# Patient Record
Sex: Male | Born: 1965 | Race: White | Hispanic: No | Marital: Single | Smoking: Former smoker
Health system: Southern US, Community
[De-identification: ages and names within clinical notes are randomized; demographics above are authoritative.]

## PROBLEM LIST (undated history)

## (undated) DIAGNOSIS — I10 Essential (primary) hypertension: Secondary | ICD-10-CM

## (undated) DIAGNOSIS — F251 Schizoaffective disorder, depressive type: Secondary | ICD-10-CM

## (undated) DIAGNOSIS — G4711 Idiopathic hypersomnia with long sleep time: Secondary | ICD-10-CM

## (undated) DIAGNOSIS — F259 Schizoaffective disorder, unspecified: Secondary | ICD-10-CM

## (undated) DIAGNOSIS — E785 Hyperlipidemia, unspecified: Secondary | ICD-10-CM

## (undated) DIAGNOSIS — G4733 Obstructive sleep apnea (adult) (pediatric): Secondary | ICD-10-CM

## (undated) DIAGNOSIS — M545 Low back pain, unspecified: Secondary | ICD-10-CM

## (undated) DIAGNOSIS — F4310 Post-traumatic stress disorder, unspecified: Secondary | ICD-10-CM

## (undated) HISTORY — PX: ABDOMINAL HERNIA REPAIR: SHX539

## (undated) HISTORY — DX: Low back pain, unspecified: M54.50

## (undated) HISTORY — DX: Hyperlipidemia, unspecified: E78.5

## (undated) HISTORY — PX: KNEE SURGERY: SHX244

## (undated) HISTORY — DX: Essential (primary) hypertension: I10

## (undated) HISTORY — DX: Schizoaffective disorder, depressive type: F25.1

## (undated) HISTORY — DX: Post-traumatic stress disorder, unspecified: F43.10

## (undated) HISTORY — DX: Idiopathic hypersomnia with long sleep time: G47.11

## (undated) HISTORY — DX: Obstructive sleep apnea (adult) (pediatric): G47.33

## (undated) HISTORY — PX: TONSILLECTOMY: SUR1361

## (undated) HISTORY — DX: Schizoaffective disorder, unspecified: F25.9

---

## 2011-06-04 DIAGNOSIS — J00 Acute nasopharyngitis [common cold]: Secondary | ICD-10-CM | POA: Diagnosis not present

## 2011-07-22 DIAGNOSIS — F848 Other pervasive developmental disorders: Secondary | ICD-10-CM | POA: Diagnosis not present

## 2011-09-08 DIAGNOSIS — B351 Tinea unguium: Secondary | ICD-10-CM | POA: Diagnosis not present

## 2011-09-08 DIAGNOSIS — D1739 Benign lipomatous neoplasm of skin and subcutaneous tissue of other sites: Secondary | ICD-10-CM | POA: Diagnosis not present

## 2011-09-08 DIAGNOSIS — B079 Viral wart, unspecified: Secondary | ICD-10-CM | POA: Diagnosis not present

## 2011-09-08 DIAGNOSIS — L219 Seborrheic dermatitis, unspecified: Secondary | ICD-10-CM | POA: Diagnosis not present

## 2011-09-08 DIAGNOSIS — L719 Rosacea, unspecified: Secondary | ICD-10-CM | POA: Diagnosis not present

## 2011-09-24 DIAGNOSIS — F848 Other pervasive developmental disorders: Secondary | ICD-10-CM | POA: Diagnosis not present

## 2011-09-30 DIAGNOSIS — R5383 Other fatigue: Secondary | ICD-10-CM | POA: Diagnosis not present

## 2011-09-30 DIAGNOSIS — R5381 Other malaise: Secondary | ICD-10-CM | POA: Diagnosis not present

## 2011-09-30 DIAGNOSIS — Z79899 Other long term (current) drug therapy: Secondary | ICD-10-CM | POA: Diagnosis not present

## 2011-09-30 DIAGNOSIS — Z0000 Encounter for general adult medical examination without abnormal findings: Secondary | ICD-10-CM | POA: Diagnosis not present

## 2011-10-01 DIAGNOSIS — F329 Major depressive disorder, single episode, unspecified: Secondary | ICD-10-CM | POA: Diagnosis not present

## 2011-10-01 DIAGNOSIS — E785 Hyperlipidemia, unspecified: Secondary | ICD-10-CM | POA: Diagnosis not present

## 2011-10-14 DIAGNOSIS — B079 Viral wart, unspecified: Secondary | ICD-10-CM | POA: Diagnosis not present

## 2011-10-14 DIAGNOSIS — L821 Other seborrheic keratosis: Secondary | ICD-10-CM | POA: Diagnosis not present

## 2011-10-14 DIAGNOSIS — B351 Tinea unguium: Secondary | ICD-10-CM | POA: Diagnosis not present

## 2011-10-14 DIAGNOSIS — D1739 Benign lipomatous neoplasm of skin and subcutaneous tissue of other sites: Secondary | ICD-10-CM | POA: Diagnosis not present

## 2011-10-14 DIAGNOSIS — L719 Rosacea, unspecified: Secondary | ICD-10-CM | POA: Diagnosis not present

## 2011-11-24 DIAGNOSIS — F848 Other pervasive developmental disorders: Secondary | ICD-10-CM | POA: Diagnosis not present

## 2011-12-06 DIAGNOSIS — H538 Other visual disturbances: Secondary | ICD-10-CM | POA: Diagnosis not present

## 2011-12-06 DIAGNOSIS — H5210 Myopia, unspecified eye: Secondary | ICD-10-CM | POA: Diagnosis not present

## 2011-12-07 DIAGNOSIS — E785 Hyperlipidemia, unspecified: Secondary | ICD-10-CM | POA: Diagnosis not present

## 2011-12-07 DIAGNOSIS — R5382 Chronic fatigue, unspecified: Secondary | ICD-10-CM | POA: Diagnosis not present

## 2011-12-10 DIAGNOSIS — E785 Hyperlipidemia, unspecified: Secondary | ICD-10-CM | POA: Diagnosis not present

## 2011-12-10 DIAGNOSIS — K219 Gastro-esophageal reflux disease without esophagitis: Secondary | ICD-10-CM | POA: Diagnosis not present

## 2011-12-10 DIAGNOSIS — F309 Manic episode, unspecified: Secondary | ICD-10-CM | POA: Diagnosis not present

## 2012-01-19 DIAGNOSIS — F848 Other pervasive developmental disorders: Secondary | ICD-10-CM | POA: Diagnosis not present

## 2012-01-25 DIAGNOSIS — L719 Rosacea, unspecified: Secondary | ICD-10-CM | POA: Diagnosis not present

## 2012-01-25 DIAGNOSIS — B079 Viral wart, unspecified: Secondary | ICD-10-CM | POA: Diagnosis not present

## 2012-01-25 DIAGNOSIS — B351 Tinea unguium: Secondary | ICD-10-CM | POA: Diagnosis not present

## 2012-02-04 DIAGNOSIS — J00 Acute nasopharyngitis [common cold]: Secondary | ICD-10-CM | POA: Diagnosis not present

## 2012-03-02 DIAGNOSIS — B079 Viral wart, unspecified: Secondary | ICD-10-CM | POA: Diagnosis not present

## 2012-03-16 DIAGNOSIS — B079 Viral wart, unspecified: Secondary | ICD-10-CM | POA: Diagnosis not present

## 2012-03-20 DIAGNOSIS — F848 Other pervasive developmental disorders: Secondary | ICD-10-CM | POA: Diagnosis not present

## 2012-03-30 DIAGNOSIS — B079 Viral wart, unspecified: Secondary | ICD-10-CM | POA: Diagnosis not present

## 2012-04-11 DIAGNOSIS — B079 Viral wart, unspecified: Secondary | ICD-10-CM | POA: Diagnosis not present

## 2012-04-12 DIAGNOSIS — Z6826 Body mass index (BMI) 26.0-26.9, adult: Secondary | ICD-10-CM | POA: Diagnosis not present

## 2012-04-12 DIAGNOSIS — Z23 Encounter for immunization: Secondary | ICD-10-CM | POA: Diagnosis not present

## 2012-04-12 DIAGNOSIS — E782 Mixed hyperlipidemia: Secondary | ICD-10-CM | POA: Diagnosis not present

## 2012-04-12 DIAGNOSIS — K219 Gastro-esophageal reflux disease without esophagitis: Secondary | ICD-10-CM | POA: Diagnosis not present

## 2012-04-12 DIAGNOSIS — F329 Major depressive disorder, single episode, unspecified: Secondary | ICD-10-CM | POA: Diagnosis not present

## 2012-04-25 DIAGNOSIS — B079 Viral wart, unspecified: Secondary | ICD-10-CM | POA: Diagnosis not present

## 2012-05-09 DIAGNOSIS — B079 Viral wart, unspecified: Secondary | ICD-10-CM | POA: Diagnosis not present

## 2012-05-17 DIAGNOSIS — E785 Hyperlipidemia, unspecified: Secondary | ICD-10-CM | POA: Diagnosis not present

## 2012-05-18 DIAGNOSIS — F848 Other pervasive developmental disorders: Secondary | ICD-10-CM | POA: Diagnosis not present

## 2012-07-27 DIAGNOSIS — R51 Headache: Secondary | ICD-10-CM | POA: Diagnosis not present

## 2012-08-07 DIAGNOSIS — H60399 Other infective otitis externa, unspecified ear: Secondary | ICD-10-CM | POA: Diagnosis not present

## 2012-08-07 DIAGNOSIS — Z6828 Body mass index (BMI) 28.0-28.9, adult: Secondary | ICD-10-CM | POA: Diagnosis not present

## 2012-09-14 DIAGNOSIS — F848 Other pervasive developmental disorders: Secondary | ICD-10-CM | POA: Diagnosis not present

## 2012-09-18 DIAGNOSIS — I10 Essential (primary) hypertension: Secondary | ICD-10-CM | POA: Diagnosis not present

## 2012-10-26 DIAGNOSIS — I10 Essential (primary) hypertension: Secondary | ICD-10-CM | POA: Diagnosis not present

## 2012-10-26 DIAGNOSIS — L259 Unspecified contact dermatitis, unspecified cause: Secondary | ICD-10-CM | POA: Diagnosis not present

## 2012-10-30 DIAGNOSIS — E785 Hyperlipidemia, unspecified: Secondary | ICD-10-CM | POA: Diagnosis not present

## 2012-10-30 DIAGNOSIS — J209 Acute bronchitis, unspecified: Secondary | ICD-10-CM | POA: Diagnosis not present

## 2012-10-30 DIAGNOSIS — F329 Major depressive disorder, single episode, unspecified: Secondary | ICD-10-CM | POA: Diagnosis not present

## 2012-10-30 DIAGNOSIS — Z6828 Body mass index (BMI) 28.0-28.9, adult: Secondary | ICD-10-CM | POA: Diagnosis not present

## 2012-10-31 DIAGNOSIS — K219 Gastro-esophageal reflux disease without esophagitis: Secondary | ICD-10-CM | POA: Diagnosis not present

## 2012-10-31 DIAGNOSIS — E785 Hyperlipidemia, unspecified: Secondary | ICD-10-CM | POA: Diagnosis not present

## 2012-10-31 DIAGNOSIS — Z6828 Body mass index (BMI) 28.0-28.9, adult: Secondary | ICD-10-CM | POA: Diagnosis not present

## 2012-10-31 DIAGNOSIS — L259 Unspecified contact dermatitis, unspecified cause: Secondary | ICD-10-CM | POA: Diagnosis not present

## 2012-11-20 DIAGNOSIS — F848 Other pervasive developmental disorders: Secondary | ICD-10-CM | POA: Diagnosis not present

## 2013-01-22 DIAGNOSIS — F848 Other pervasive developmental disorders: Secondary | ICD-10-CM | POA: Diagnosis not present

## 2013-01-23 DIAGNOSIS — Z0100 Encounter for examination of eyes and vision without abnormal findings: Secondary | ICD-10-CM | POA: Diagnosis not present

## 2013-03-19 DIAGNOSIS — F848 Other pervasive developmental disorders: Secondary | ICD-10-CM | POA: Diagnosis not present

## 2013-04-02 DIAGNOSIS — K219 Gastro-esophageal reflux disease without esophagitis: Secondary | ICD-10-CM | POA: Diagnosis not present

## 2013-04-02 DIAGNOSIS — E785 Hyperlipidemia, unspecified: Secondary | ICD-10-CM | POA: Diagnosis not present

## 2013-04-02 DIAGNOSIS — Z6828 Body mass index (BMI) 28.0-28.9, adult: Secondary | ICD-10-CM | POA: Diagnosis not present

## 2013-04-02 DIAGNOSIS — J209 Acute bronchitis, unspecified: Secondary | ICD-10-CM | POA: Diagnosis not present

## 2013-04-19 DIAGNOSIS — I10 Essential (primary) hypertension: Secondary | ICD-10-CM | POA: Diagnosis not present

## 2013-04-19 DIAGNOSIS — Z6827 Body mass index (BMI) 27.0-27.9, adult: Secondary | ICD-10-CM | POA: Diagnosis not present

## 2013-04-19 DIAGNOSIS — K219 Gastro-esophageal reflux disease without esophagitis: Secondary | ICD-10-CM | POA: Diagnosis not present

## 2013-04-19 DIAGNOSIS — Z23 Encounter for immunization: Secondary | ICD-10-CM | POA: Diagnosis not present

## 2013-04-19 DIAGNOSIS — E785 Hyperlipidemia, unspecified: Secondary | ICD-10-CM | POA: Diagnosis not present

## 2013-05-17 DIAGNOSIS — F848 Other pervasive developmental disorders: Secondary | ICD-10-CM | POA: Diagnosis not present

## 2013-06-19 DIAGNOSIS — I10 Essential (primary) hypertension: Secondary | ICD-10-CM | POA: Diagnosis not present

## 2013-06-19 DIAGNOSIS — F329 Major depressive disorder, single episode, unspecified: Secondary | ICD-10-CM | POA: Diagnosis not present

## 2013-06-19 DIAGNOSIS — E785 Hyperlipidemia, unspecified: Secondary | ICD-10-CM | POA: Diagnosis not present

## 2013-06-19 DIAGNOSIS — K219 Gastro-esophageal reflux disease without esophagitis: Secondary | ICD-10-CM | POA: Diagnosis not present

## 2013-06-21 DIAGNOSIS — E785 Hyperlipidemia, unspecified: Secondary | ICD-10-CM | POA: Diagnosis not present

## 2013-06-21 DIAGNOSIS — K219 Gastro-esophageal reflux disease without esophagitis: Secondary | ICD-10-CM | POA: Diagnosis not present

## 2013-06-21 DIAGNOSIS — F329 Major depressive disorder, single episode, unspecified: Secondary | ICD-10-CM | POA: Diagnosis not present

## 2013-06-21 DIAGNOSIS — J029 Acute pharyngitis, unspecified: Secondary | ICD-10-CM | POA: Diagnosis not present

## 2013-06-26 DIAGNOSIS — I10 Essential (primary) hypertension: Secondary | ICD-10-CM | POA: Diagnosis not present

## 2013-06-26 DIAGNOSIS — E785 Hyperlipidemia, unspecified: Secondary | ICD-10-CM | POA: Diagnosis not present

## 2013-06-26 DIAGNOSIS — K219 Gastro-esophageal reflux disease without esophagitis: Secondary | ICD-10-CM | POA: Diagnosis not present

## 2013-06-26 DIAGNOSIS — F329 Major depressive disorder, single episode, unspecified: Secondary | ICD-10-CM | POA: Diagnosis not present

## 2013-06-29 DIAGNOSIS — E785 Hyperlipidemia, unspecified: Secondary | ICD-10-CM | POA: Diagnosis not present

## 2013-06-29 DIAGNOSIS — J209 Acute bronchitis, unspecified: Secondary | ICD-10-CM | POA: Diagnosis not present

## 2013-06-29 DIAGNOSIS — I10 Essential (primary) hypertension: Secondary | ICD-10-CM | POA: Diagnosis not present

## 2013-06-29 DIAGNOSIS — K219 Gastro-esophageal reflux disease without esophagitis: Secondary | ICD-10-CM | POA: Diagnosis not present

## 2013-07-03 DIAGNOSIS — F848 Other pervasive developmental disorders: Secondary | ICD-10-CM | POA: Diagnosis not present

## 2013-07-03 DIAGNOSIS — F259 Schizoaffective disorder, unspecified: Secondary | ICD-10-CM | POA: Diagnosis not present

## 2013-07-06 DIAGNOSIS — K219 Gastro-esophageal reflux disease without esophagitis: Secondary | ICD-10-CM | POA: Diagnosis not present

## 2013-07-06 DIAGNOSIS — I10 Essential (primary) hypertension: Secondary | ICD-10-CM | POA: Diagnosis not present

## 2013-07-06 DIAGNOSIS — F329 Major depressive disorder, single episode, unspecified: Secondary | ICD-10-CM | POA: Diagnosis not present

## 2013-07-06 DIAGNOSIS — E785 Hyperlipidemia, unspecified: Secondary | ICD-10-CM | POA: Diagnosis not present

## 2013-07-20 DIAGNOSIS — E785 Hyperlipidemia, unspecified: Secondary | ICD-10-CM | POA: Diagnosis not present

## 2013-07-20 DIAGNOSIS — I10 Essential (primary) hypertension: Secondary | ICD-10-CM | POA: Diagnosis not present

## 2013-07-20 DIAGNOSIS — Z0000 Encounter for general adult medical examination without abnormal findings: Secondary | ICD-10-CM | POA: Diagnosis not present

## 2013-07-20 DIAGNOSIS — Z125 Encounter for screening for malignant neoplasm of prostate: Secondary | ICD-10-CM | POA: Diagnosis not present

## 2013-08-14 DIAGNOSIS — K219 Gastro-esophageal reflux disease without esophagitis: Secondary | ICD-10-CM | POA: Diagnosis not present

## 2013-08-14 DIAGNOSIS — I10 Essential (primary) hypertension: Secondary | ICD-10-CM | POA: Diagnosis not present

## 2013-08-14 DIAGNOSIS — R6884 Jaw pain: Secondary | ICD-10-CM | POA: Diagnosis not present

## 2013-08-14 DIAGNOSIS — Z6829 Body mass index (BMI) 29.0-29.9, adult: Secondary | ICD-10-CM | POA: Diagnosis not present

## 2013-08-23 DIAGNOSIS — I10 Essential (primary) hypertension: Secondary | ICD-10-CM | POA: Diagnosis not present

## 2013-08-23 DIAGNOSIS — K219 Gastro-esophageal reflux disease without esophagitis: Secondary | ICD-10-CM | POA: Diagnosis not present

## 2013-08-23 DIAGNOSIS — M79609 Pain in unspecified limb: Secondary | ICD-10-CM | POA: Diagnosis not present

## 2013-08-23 DIAGNOSIS — Z6829 Body mass index (BMI) 29.0-29.9, adult: Secondary | ICD-10-CM | POA: Diagnosis not present

## 2013-08-28 DIAGNOSIS — K219 Gastro-esophageal reflux disease without esophagitis: Secondary | ICD-10-CM | POA: Diagnosis not present

## 2013-08-28 DIAGNOSIS — F329 Major depressive disorder, single episode, unspecified: Secondary | ICD-10-CM | POA: Diagnosis not present

## 2013-08-28 DIAGNOSIS — I10 Essential (primary) hypertension: Secondary | ICD-10-CM | POA: Diagnosis not present

## 2013-08-28 DIAGNOSIS — M25529 Pain in unspecified elbow: Secondary | ICD-10-CM | POA: Diagnosis not present

## 2013-09-04 DIAGNOSIS — E785 Hyperlipidemia, unspecified: Secondary | ICD-10-CM | POA: Diagnosis not present

## 2013-09-04 DIAGNOSIS — K219 Gastro-esophageal reflux disease without esophagitis: Secondary | ICD-10-CM | POA: Diagnosis not present

## 2013-09-04 DIAGNOSIS — R7989 Other specified abnormal findings of blood chemistry: Secondary | ICD-10-CM | POA: Diagnosis not present

## 2013-09-04 DIAGNOSIS — I10 Essential (primary) hypertension: Secondary | ICD-10-CM | POA: Diagnosis not present

## 2013-09-17 DIAGNOSIS — M25529 Pain in unspecified elbow: Secondary | ICD-10-CM | POA: Diagnosis not present

## 2013-09-17 DIAGNOSIS — R7989 Other specified abnormal findings of blood chemistry: Secondary | ICD-10-CM | POA: Diagnosis not present

## 2013-09-17 DIAGNOSIS — K219 Gastro-esophageal reflux disease without esophagitis: Secondary | ICD-10-CM | POA: Diagnosis not present

## 2013-09-17 DIAGNOSIS — I10 Essential (primary) hypertension: Secondary | ICD-10-CM | POA: Diagnosis not present

## 2013-09-25 DIAGNOSIS — M25529 Pain in unspecified elbow: Secondary | ICD-10-CM | POA: Diagnosis not present

## 2013-10-03 DIAGNOSIS — I10 Essential (primary) hypertension: Secondary | ICD-10-CM | POA: Diagnosis not present

## 2013-10-03 DIAGNOSIS — E785 Hyperlipidemia, unspecified: Secondary | ICD-10-CM | POA: Diagnosis not present

## 2013-10-03 DIAGNOSIS — R7989 Other specified abnormal findings of blood chemistry: Secondary | ICD-10-CM | POA: Diagnosis not present

## 2013-10-03 DIAGNOSIS — K219 Gastro-esophageal reflux disease without esophagitis: Secondary | ICD-10-CM | POA: Diagnosis not present

## 2013-11-02 DIAGNOSIS — I10 Essential (primary) hypertension: Secondary | ICD-10-CM | POA: Diagnosis not present

## 2013-11-02 DIAGNOSIS — E785 Hyperlipidemia, unspecified: Secondary | ICD-10-CM | POA: Diagnosis not present

## 2013-11-02 DIAGNOSIS — F329 Major depressive disorder, single episode, unspecified: Secondary | ICD-10-CM | POA: Diagnosis not present

## 2013-11-02 DIAGNOSIS — K219 Gastro-esophageal reflux disease without esophagitis: Secondary | ICD-10-CM | POA: Diagnosis not present

## 2013-11-29 DIAGNOSIS — E785 Hyperlipidemia, unspecified: Secondary | ICD-10-CM | POA: Diagnosis not present

## 2013-11-29 DIAGNOSIS — R7989 Other specified abnormal findings of blood chemistry: Secondary | ICD-10-CM | POA: Diagnosis not present

## 2013-11-29 DIAGNOSIS — K219 Gastro-esophageal reflux disease without esophagitis: Secondary | ICD-10-CM | POA: Diagnosis not present

## 2013-11-29 DIAGNOSIS — I10 Essential (primary) hypertension: Secondary | ICD-10-CM | POA: Diagnosis not present

## 2013-12-26 DIAGNOSIS — L719 Rosacea, unspecified: Secondary | ICD-10-CM | POA: Diagnosis not present

## 2013-12-26 DIAGNOSIS — L981 Factitial dermatitis: Secondary | ICD-10-CM | POA: Diagnosis not present

## 2013-12-26 DIAGNOSIS — B079 Viral wart, unspecified: Secondary | ICD-10-CM | POA: Diagnosis not present

## 2014-01-23 DIAGNOSIS — L719 Rosacea, unspecified: Secondary | ICD-10-CM | POA: Diagnosis not present

## 2014-01-30 DIAGNOSIS — R7989 Other specified abnormal findings of blood chemistry: Secondary | ICD-10-CM | POA: Diagnosis not present

## 2014-01-30 DIAGNOSIS — I10 Essential (primary) hypertension: Secondary | ICD-10-CM | POA: Diagnosis not present

## 2014-01-30 DIAGNOSIS — E785 Hyperlipidemia, unspecified: Secondary | ICD-10-CM | POA: Diagnosis not present

## 2014-01-30 DIAGNOSIS — K219 Gastro-esophageal reflux disease without esophagitis: Secondary | ICD-10-CM | POA: Diagnosis not present

## 2014-03-26 DIAGNOSIS — L719 Rosacea, unspecified: Secondary | ICD-10-CM | POA: Diagnosis not present

## 2014-03-28 DIAGNOSIS — L719 Rosacea, unspecified: Secondary | ICD-10-CM | POA: Diagnosis not present

## 2014-04-02 DIAGNOSIS — Z23 Encounter for immunization: Secondary | ICD-10-CM | POA: Diagnosis not present

## 2014-04-02 DIAGNOSIS — E785 Hyperlipidemia, unspecified: Secondary | ICD-10-CM | POA: Diagnosis not present

## 2014-04-02 DIAGNOSIS — R7989 Other specified abnormal findings of blood chemistry: Secondary | ICD-10-CM | POA: Diagnosis not present

## 2014-04-02 DIAGNOSIS — F329 Major depressive disorder, single episode, unspecified: Secondary | ICD-10-CM | POA: Diagnosis not present

## 2014-04-02 DIAGNOSIS — I10 Essential (primary) hypertension: Secondary | ICD-10-CM | POA: Diagnosis not present

## 2014-04-02 DIAGNOSIS — Z6824 Body mass index (BMI) 24.0-24.9, adult: Secondary | ICD-10-CM | POA: Diagnosis not present

## 2014-04-02 DIAGNOSIS — K219 Gastro-esophageal reflux disease without esophagitis: Secondary | ICD-10-CM | POA: Diagnosis not present

## 2014-06-03 DIAGNOSIS — F329 Major depressive disorder, single episode, unspecified: Secondary | ICD-10-CM | POA: Diagnosis not present

## 2014-06-03 DIAGNOSIS — K219 Gastro-esophageal reflux disease without esophagitis: Secondary | ICD-10-CM | POA: Diagnosis not present

## 2014-06-03 DIAGNOSIS — Z6825 Body mass index (BMI) 25.0-25.9, adult: Secondary | ICD-10-CM | POA: Diagnosis not present

## 2014-06-03 DIAGNOSIS — E785 Hyperlipidemia, unspecified: Secondary | ICD-10-CM | POA: Diagnosis not present

## 2014-06-03 DIAGNOSIS — I10 Essential (primary) hypertension: Secondary | ICD-10-CM | POA: Diagnosis not present

## 2014-06-03 DIAGNOSIS — R7989 Other specified abnormal findings of blood chemistry: Secondary | ICD-10-CM | POA: Diagnosis not present

## 2014-06-04 DIAGNOSIS — M778 Other enthesopathies, not elsewhere classified: Secondary | ICD-10-CM | POA: Diagnosis not present

## 2014-06-12 ENCOUNTER — Encounter (HOSPITAL_COMMUNITY): Payer: Self-pay | Admitting: Psychiatry

## 2014-06-12 ENCOUNTER — Ambulatory Visit (INDEPENDENT_AMBULATORY_CARE_PROVIDER_SITE_OTHER): Payer: Medicare Other | Admitting: Psychiatry

## 2014-06-12 ENCOUNTER — Encounter (INDEPENDENT_AMBULATORY_CARE_PROVIDER_SITE_OTHER): Payer: Self-pay

## 2014-06-12 VITALS — BP 113/75 | HR 83 | Ht 68.5 in | Wt 168.8 lb

## 2014-06-12 DIAGNOSIS — F251 Schizoaffective disorder, depressive type: Secondary | ICD-10-CM | POA: Diagnosis not present

## 2014-06-12 MED ORDER — ARIPIPRAZOLE 15 MG PO TABS
ORAL_TABLET | ORAL | Status: DC
Start: 2014-06-12 — End: 2014-07-24
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
## 2014-06-13 DIAGNOSIS — M7711 Lateral epicondylitis, right elbow: Secondary | ICD-10-CM | POA: Diagnosis not present

## 2014-06-13 DIAGNOSIS — M25521 Pain in right elbow: Secondary | ICD-10-CM | POA: Diagnosis not present

## 2014-06-13 DIAGNOSIS — M778 Other enthesopathies, not elsewhere classified: Secondary | ICD-10-CM | POA: Diagnosis not present

## 2014-06-18 DIAGNOSIS — M778 Other enthesopathies, not elsewhere classified: Secondary | ICD-10-CM | POA: Diagnosis not present

## 2014-06-26 DIAGNOSIS — M778 Other enthesopathies, not elsewhere classified: Secondary | ICD-10-CM | POA: Diagnosis not present

## 2014-06-26 DIAGNOSIS — M25631 Stiffness of right wrist, not elsewhere classified: Secondary | ICD-10-CM | POA: Diagnosis not present

## 2014-06-26 DIAGNOSIS — M25521 Pain in right elbow: Secondary | ICD-10-CM | POA: Diagnosis not present

## 2014-06-27 DIAGNOSIS — M25521 Pain in right elbow: Secondary | ICD-10-CM | POA: Diagnosis not present

## 2014-06-27 DIAGNOSIS — M25631 Stiffness of right wrist, not elsewhere classified: Secondary | ICD-10-CM | POA: Diagnosis not present

## 2014-06-27 DIAGNOSIS — M778 Other enthesopathies, not elsewhere classified: Secondary | ICD-10-CM | POA: Diagnosis not present

## 2014-07-02 DIAGNOSIS — M25521 Pain in right elbow: Secondary | ICD-10-CM | POA: Diagnosis not present

## 2014-07-02 DIAGNOSIS — M25631 Stiffness of right wrist, not elsewhere classified: Secondary | ICD-10-CM | POA: Diagnosis not present

## 2014-07-02 DIAGNOSIS — M778 Other enthesopathies, not elsewhere classified: Secondary | ICD-10-CM | POA: Diagnosis not present

## 2014-07-03 DIAGNOSIS — R7989 Other specified abnormal findings of blood chemistry: Secondary | ICD-10-CM | POA: Diagnosis not present

## 2014-07-03 DIAGNOSIS — F329 Major depressive disorder, single episode, unspecified: Secondary | ICD-10-CM | POA: Diagnosis not present

## 2014-07-03 DIAGNOSIS — M25512 Pain in left shoulder: Secondary | ICD-10-CM | POA: Diagnosis not present

## 2014-07-03 DIAGNOSIS — E785 Hyperlipidemia, unspecified: Secondary | ICD-10-CM | POA: Diagnosis not present

## 2014-07-03 DIAGNOSIS — Z6825 Body mass index (BMI) 25.0-25.9, adult: Secondary | ICD-10-CM | POA: Diagnosis not present

## 2014-07-03 DIAGNOSIS — K219 Gastro-esophageal reflux disease without esophagitis: Secondary | ICD-10-CM | POA: Diagnosis not present

## 2014-07-03 DIAGNOSIS — I10 Essential (primary) hypertension: Secondary | ICD-10-CM | POA: Diagnosis not present

## 2014-07-04 DIAGNOSIS — M25631 Stiffness of right wrist, not elsewhere classified: Secondary | ICD-10-CM | POA: Diagnosis not present

## 2014-07-04 DIAGNOSIS — M25521 Pain in right elbow: Secondary | ICD-10-CM | POA: Diagnosis not present

## 2014-07-04 DIAGNOSIS — M778 Other enthesopathies, not elsewhere classified: Secondary | ICD-10-CM | POA: Diagnosis not present

## 2014-07-09 DIAGNOSIS — M25521 Pain in right elbow: Secondary | ICD-10-CM | POA: Diagnosis not present

## 2014-07-09 DIAGNOSIS — M25631 Stiffness of right wrist, not elsewhere classified: Secondary | ICD-10-CM | POA: Diagnosis not present

## 2014-07-09 DIAGNOSIS — M778 Other enthesopathies, not elsewhere classified: Secondary | ICD-10-CM | POA: Diagnosis not present

## 2014-07-11 DIAGNOSIS — M25521 Pain in right elbow: Secondary | ICD-10-CM | POA: Diagnosis not present

## 2014-07-11 DIAGNOSIS — M25631 Stiffness of right wrist, not elsewhere classified: Secondary | ICD-10-CM | POA: Diagnosis not present

## 2014-07-11 DIAGNOSIS — M778 Other enthesopathies, not elsewhere classified: Secondary | ICD-10-CM | POA: Diagnosis not present

## 2014-07-17 DIAGNOSIS — M25631 Stiffness of right wrist, not elsewhere classified: Secondary | ICD-10-CM | POA: Diagnosis not present

## 2014-07-17 DIAGNOSIS — M25521 Pain in right elbow: Secondary | ICD-10-CM | POA: Diagnosis not present

## 2014-07-17 DIAGNOSIS — M778 Other enthesopathies, not elsewhere classified: Secondary | ICD-10-CM | POA: Diagnosis not present

## 2014-07-23 DIAGNOSIS — M778 Other enthesopathies, not elsewhere classified: Secondary | ICD-10-CM | POA: Diagnosis not present

## 2014-07-23 DIAGNOSIS — M25521 Pain in right elbow: Secondary | ICD-10-CM | POA: Diagnosis not present

## 2014-07-23 DIAGNOSIS — M25631 Stiffness of right wrist, not elsewhere classified: Secondary | ICD-10-CM | POA: Diagnosis not present

## 2014-07-24 ENCOUNTER — Ambulatory Visit (INDEPENDENT_AMBULATORY_CARE_PROVIDER_SITE_OTHER): Payer: Medicare Other | Admitting: Psychiatry

## 2014-07-24 ENCOUNTER — Encounter (HOSPITAL_COMMUNITY): Payer: Self-pay | Admitting: Psychiatry

## 2014-07-24 VITALS — BP 117/75 | HR 78 | Ht 68.5 in | Wt 168.6 lb

## 2014-07-24 DIAGNOSIS — F251 Schizoaffective disorder, depressive type: Secondary | ICD-10-CM

## 2014-07-24 MED ORDER — ARIPIPRAZOLE 10 MG PO TABS: 10.0000 mg | ORAL_TABLET | Freq: Every day | ORAL | Status: DC
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
## 2014-08-05 DIAGNOSIS — M778 Other enthesopathies, not elsewhere classified: Secondary | ICD-10-CM | POA: Diagnosis not present

## 2014-08-05 DIAGNOSIS — M25521 Pain in right elbow: Secondary | ICD-10-CM | POA: Diagnosis not present

## 2014-08-05 DIAGNOSIS — M25631 Stiffness of right wrist, not elsewhere classified: Secondary | ICD-10-CM | POA: Diagnosis not present

## 2014-08-06 DIAGNOSIS — M778 Other enthesopathies, not elsewhere classified: Secondary | ICD-10-CM | POA: Diagnosis not present

## 2014-08-08 DIAGNOSIS — E785 Hyperlipidemia, unspecified: Secondary | ICD-10-CM | POA: Diagnosis not present

## 2014-08-08 DIAGNOSIS — Z6825 Body mass index (BMI) 25.0-25.9, adult: Secondary | ICD-10-CM | POA: Diagnosis not present

## 2014-08-08 DIAGNOSIS — F329 Major depressive disorder, single episode, unspecified: Secondary | ICD-10-CM | POA: Diagnosis not present

## 2014-08-08 DIAGNOSIS — K219 Gastro-esophageal reflux disease without esophagitis: Secondary | ICD-10-CM | POA: Diagnosis not present

## 2014-08-08 DIAGNOSIS — R7989 Other specified abnormal findings of blood chemistry: Secondary | ICD-10-CM | POA: Diagnosis not present

## 2014-08-08 DIAGNOSIS — I10 Essential (primary) hypertension: Secondary | ICD-10-CM | POA: Diagnosis not present

## 2014-08-12 DIAGNOSIS — M25521 Pain in right elbow: Secondary | ICD-10-CM | POA: Diagnosis not present

## 2014-08-12 DIAGNOSIS — M778 Other enthesopathies, not elsewhere classified: Secondary | ICD-10-CM | POA: Diagnosis not present

## 2014-08-12 DIAGNOSIS — M25631 Stiffness of right wrist, not elsewhere classified: Secondary | ICD-10-CM | POA: Diagnosis not present

## 2014-08-19 DIAGNOSIS — M25521 Pain in right elbow: Secondary | ICD-10-CM | POA: Diagnosis not present

## 2014-08-19 DIAGNOSIS — M778 Other enthesopathies, not elsewhere classified: Secondary | ICD-10-CM | POA: Diagnosis not present

## 2014-08-19 DIAGNOSIS — M25631 Stiffness of right wrist, not elsewhere classified: Secondary | ICD-10-CM | POA: Diagnosis not present

## 2014-08-27 DIAGNOSIS — M25521 Pain in right elbow: Secondary | ICD-10-CM | POA: Diagnosis not present

## 2014-08-27 DIAGNOSIS — M25631 Stiffness of right wrist, not elsewhere classified: Secondary | ICD-10-CM | POA: Diagnosis not present

## 2014-08-27 DIAGNOSIS — M778 Other enthesopathies, not elsewhere classified: Secondary | ICD-10-CM | POA: Diagnosis not present

## 2014-09-03 DIAGNOSIS — M778 Other enthesopathies, not elsewhere classified: Secondary | ICD-10-CM | POA: Diagnosis not present

## 2014-09-03 DIAGNOSIS — M25631 Stiffness of right wrist, not elsewhere classified: Secondary | ICD-10-CM | POA: Diagnosis not present

## 2014-09-03 DIAGNOSIS — M25521 Pain in right elbow: Secondary | ICD-10-CM | POA: Diagnosis not present

## 2014-09-23 ENCOUNTER — Ambulatory Visit (INDEPENDENT_AMBULATORY_CARE_PROVIDER_SITE_OTHER): Payer: Medicare Other | Admitting: Psychiatry

## 2014-09-23 ENCOUNTER — Encounter (HOSPITAL_COMMUNITY): Payer: Self-pay | Admitting: Psychiatry

## 2014-09-23 VITALS — BP 131/85 | HR 84 | Ht 68.5 in | Wt 167.8 lb

## 2014-09-23 DIAGNOSIS — F251 Schizoaffective disorder, depressive type: Secondary | ICD-10-CM | POA: Diagnosis not present

## 2014-09-23 MED ORDER — ARIPIPRAZOLE 10 MG PO TABS: 10.0000 mg | ORAL_TABLET | Freq: Every day | ORAL | Status: DC
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
## 2014-10-02 DIAGNOSIS — L719 Rosacea, unspecified: Secondary | ICD-10-CM | POA: Diagnosis not present

## 2014-10-02 DIAGNOSIS — L219 Seborrheic dermatitis, unspecified: Secondary | ICD-10-CM | POA: Diagnosis not present

## 2014-10-08 DIAGNOSIS — K219 Gastro-esophageal reflux disease without esophagitis: Secondary | ICD-10-CM | POA: Diagnosis not present

## 2014-10-08 DIAGNOSIS — Z1389 Encounter for screening for other disorder: Secondary | ICD-10-CM | POA: Diagnosis not present

## 2014-10-08 DIAGNOSIS — Z6825 Body mass index (BMI) 25.0-25.9, adult: Secondary | ICD-10-CM | POA: Diagnosis not present

## 2014-10-08 DIAGNOSIS — F329 Major depressive disorder, single episode, unspecified: Secondary | ICD-10-CM | POA: Diagnosis not present

## 2014-10-08 DIAGNOSIS — I10 Essential (primary) hypertension: Secondary | ICD-10-CM | POA: Diagnosis not present

## 2014-10-08 DIAGNOSIS — R7989 Other specified abnormal findings of blood chemistry: Secondary | ICD-10-CM | POA: Diagnosis not present

## 2014-10-08 DIAGNOSIS — E785 Hyperlipidemia, unspecified: Secondary | ICD-10-CM | POA: Diagnosis not present

## 2014-10-23 DIAGNOSIS — K210 Gastro-esophageal reflux disease with esophagitis: Secondary | ICD-10-CM | POA: Diagnosis not present

## 2014-10-23 DIAGNOSIS — K648 Other hemorrhoids: Secondary | ICD-10-CM | POA: Diagnosis not present

## 2014-10-30 DIAGNOSIS — M722 Plantar fascial fibromatosis: Secondary | ICD-10-CM

## 2014-11-05 DIAGNOSIS — I10 Essential (primary) hypertension: Secondary | ICD-10-CM | POA: Diagnosis not present

## 2014-11-05 DIAGNOSIS — K644 Residual hemorrhoidal skin tags: Secondary | ICD-10-CM | POA: Diagnosis not present

## 2014-11-05 DIAGNOSIS — K5730 Diverticulosis of large intestine without perforation or abscess without bleeding: Secondary | ICD-10-CM | POA: Diagnosis not present

## 2014-11-05 DIAGNOSIS — Z800 Family history of malignant neoplasm of digestive organs: Secondary | ICD-10-CM | POA: Diagnosis not present

## 2014-11-05 DIAGNOSIS — F329 Major depressive disorder, single episode, unspecified: Secondary | ICD-10-CM | POA: Diagnosis not present

## 2014-11-05 DIAGNOSIS — M1990 Unspecified osteoarthritis, unspecified site: Secondary | ICD-10-CM | POA: Diagnosis not present

## 2014-11-05 DIAGNOSIS — K648 Other hemorrhoids: Secondary | ICD-10-CM | POA: Diagnosis not present

## 2014-11-05 DIAGNOSIS — Z1211 Encounter for screening for malignant neoplasm of colon: Secondary | ICD-10-CM | POA: Diagnosis not present

## 2014-11-25 ENCOUNTER — Ambulatory Visit (HOSPITAL_COMMUNITY): Payer: Self-pay | Admitting: Psychiatry

## 2014-12-03 DIAGNOSIS — R7989 Other specified abnormal findings of blood chemistry: Secondary | ICD-10-CM | POA: Diagnosis not present

## 2014-12-03 DIAGNOSIS — I10 Essential (primary) hypertension: Secondary | ICD-10-CM | POA: Diagnosis not present

## 2014-12-03 DIAGNOSIS — L309 Dermatitis, unspecified: Secondary | ICD-10-CM | POA: Diagnosis not present

## 2014-12-03 DIAGNOSIS — Z6826 Body mass index (BMI) 26.0-26.9, adult: Secondary | ICD-10-CM | POA: Diagnosis not present

## 2014-12-03 DIAGNOSIS — F329 Major depressive disorder, single episode, unspecified: Secondary | ICD-10-CM | POA: Diagnosis not present

## 2014-12-03 DIAGNOSIS — K219 Gastro-esophageal reflux disease without esophagitis: Secondary | ICD-10-CM | POA: Diagnosis not present

## 2014-12-03 DIAGNOSIS — E785 Hyperlipidemia, unspecified: Secondary | ICD-10-CM | POA: Diagnosis not present

## 2014-12-04 ENCOUNTER — Ambulatory Visit (HOSPITAL_COMMUNITY): Payer: Self-pay | Admitting: Psychiatry

## 2014-12-09 DIAGNOSIS — R7989 Other specified abnormal findings of blood chemistry: Secondary | ICD-10-CM | POA: Diagnosis not present

## 2014-12-09 DIAGNOSIS — E785 Hyperlipidemia, unspecified: Secondary | ICD-10-CM | POA: Diagnosis not present

## 2014-12-09 DIAGNOSIS — K219 Gastro-esophageal reflux disease without esophagitis: Secondary | ICD-10-CM | POA: Diagnosis not present

## 2014-12-09 DIAGNOSIS — Z6825 Body mass index (BMI) 25.0-25.9, adult: Secondary | ICD-10-CM | POA: Diagnosis not present

## 2014-12-09 DIAGNOSIS — I10 Essential (primary) hypertension: Secondary | ICD-10-CM | POA: Diagnosis not present

## 2014-12-09 DIAGNOSIS — F329 Major depressive disorder, single episode, unspecified: Secondary | ICD-10-CM | POA: Diagnosis not present

## 2015-01-02 DIAGNOSIS — L719 Rosacea, unspecified: Secondary | ICD-10-CM | POA: Diagnosis not present

## 2015-01-02 DIAGNOSIS — L309 Dermatitis, unspecified: Secondary | ICD-10-CM | POA: Diagnosis not present

## 2015-01-06 ENCOUNTER — Ambulatory Visit (INDEPENDENT_AMBULATORY_CARE_PROVIDER_SITE_OTHER): Payer: Medicare Other | Admitting: Psychiatry

## 2015-01-06 ENCOUNTER — Encounter (HOSPITAL_COMMUNITY): Payer: Self-pay | Admitting: Psychiatry

## 2015-01-06 VITALS — BP 110/80 | HR 79 | Ht 68.5 in | Wt 172.6 lb

## 2015-01-06 DIAGNOSIS — F251 Schizoaffective disorder, depressive type: Secondary | ICD-10-CM

## 2015-01-06 MED ORDER — ARIPIPRAZOLE 10 MG PO TABS: 10.0000 mg | ORAL_TABLET | Freq: Every day | ORAL | Status: DC
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
## 2015-02-10 DIAGNOSIS — K219 Gastro-esophageal reflux disease without esophagitis: Secondary | ICD-10-CM | POA: Diagnosis not present

## 2015-02-10 DIAGNOSIS — F329 Major depressive disorder, single episode, unspecified: Secondary | ICD-10-CM | POA: Diagnosis not present

## 2015-02-10 DIAGNOSIS — E785 Hyperlipidemia, unspecified: Secondary | ICD-10-CM | POA: Diagnosis not present

## 2015-02-10 DIAGNOSIS — R7889 Finding of other specified substances, not normally found in blood: Secondary | ICD-10-CM | POA: Diagnosis not present

## 2015-02-10 DIAGNOSIS — I10 Essential (primary) hypertension: Secondary | ICD-10-CM | POA: Diagnosis not present

## 2015-02-10 DIAGNOSIS — Z6825 Body mass index (BMI) 25.0-25.9, adult: Secondary | ICD-10-CM | POA: Diagnosis not present

## 2015-03-05 DIAGNOSIS — F329 Major depressive disorder, single episode, unspecified: Secondary | ICD-10-CM | POA: Diagnosis not present

## 2015-03-05 DIAGNOSIS — I10 Essential (primary) hypertension: Secondary | ICD-10-CM | POA: Diagnosis not present

## 2015-03-05 DIAGNOSIS — Z23 Encounter for immunization: Secondary | ICD-10-CM | POA: Diagnosis not present

## 2015-03-05 DIAGNOSIS — R7989 Other specified abnormal findings of blood chemistry: Secondary | ICD-10-CM | POA: Diagnosis not present

## 2015-03-05 DIAGNOSIS — K219 Gastro-esophageal reflux disease without esophagitis: Secondary | ICD-10-CM | POA: Diagnosis not present

## 2015-03-05 DIAGNOSIS — L719 Rosacea, unspecified: Secondary | ICD-10-CM | POA: Diagnosis not present

## 2015-03-05 DIAGNOSIS — Z6825 Body mass index (BMI) 25.0-25.9, adult: Secondary | ICD-10-CM | POA: Diagnosis not present

## 2015-03-05 DIAGNOSIS — E785 Hyperlipidemia, unspecified: Secondary | ICD-10-CM | POA: Diagnosis not present

## 2015-03-13 DIAGNOSIS — I10 Essential (primary) hypertension: Secondary | ICD-10-CM | POA: Diagnosis not present

## 2015-03-13 DIAGNOSIS — R7989 Other specified abnormal findings of blood chemistry: Secondary | ICD-10-CM | POA: Diagnosis not present

## 2015-03-13 DIAGNOSIS — K219 Gastro-esophageal reflux disease without esophagitis: Secondary | ICD-10-CM | POA: Diagnosis not present

## 2015-03-13 DIAGNOSIS — Z6825 Body mass index (BMI) 25.0-25.9, adult: Secondary | ICD-10-CM | POA: Diagnosis not present

## 2015-03-13 DIAGNOSIS — M542 Cervicalgia: Secondary | ICD-10-CM | POA: Diagnosis not present

## 2015-03-13 DIAGNOSIS — F329 Major depressive disorder, single episode, unspecified: Secondary | ICD-10-CM | POA: Diagnosis not present

## 2015-03-13 DIAGNOSIS — E785 Hyperlipidemia, unspecified: Secondary | ICD-10-CM | POA: Diagnosis not present

## 2015-03-21 DIAGNOSIS — K219 Gastro-esophageal reflux disease without esophagitis: Secondary | ICD-10-CM | POA: Diagnosis not present

## 2015-03-21 DIAGNOSIS — M542 Cervicalgia: Secondary | ICD-10-CM | POA: Diagnosis not present

## 2015-03-21 DIAGNOSIS — E785 Hyperlipidemia, unspecified: Secondary | ICD-10-CM | POA: Diagnosis not present

## 2015-03-21 DIAGNOSIS — I10 Essential (primary) hypertension: Secondary | ICD-10-CM | POA: Diagnosis not present

## 2015-03-21 DIAGNOSIS — F329 Major depressive disorder, single episode, unspecified: Secondary | ICD-10-CM | POA: Diagnosis not present

## 2015-03-21 DIAGNOSIS — R7989 Other specified abnormal findings of blood chemistry: Secondary | ICD-10-CM | POA: Diagnosis not present

## 2015-03-21 DIAGNOSIS — Z6825 Body mass index (BMI) 25.0-25.9, adult: Secondary | ICD-10-CM | POA: Diagnosis not present

## 2015-04-08 ENCOUNTER — Encounter (HOSPITAL_COMMUNITY): Payer: Self-pay | Admitting: Psychiatry

## 2015-04-08 ENCOUNTER — Ambulatory Visit (INDEPENDENT_AMBULATORY_CARE_PROVIDER_SITE_OTHER): Payer: Medicare Other | Admitting: Psychiatry

## 2015-04-08 VITALS — BP 120/81 | HR 111 | Ht 68.0 in | Wt 175.4 lb

## 2015-04-08 DIAGNOSIS — F251 Schizoaffective disorder, depressive type: Secondary | ICD-10-CM

## 2015-04-08 MED ORDER — ARIPIPRAZOLE 10 MG PO TABS: 10.0000 mg | ORAL_TABLET | Freq: Every day | ORAL | Status: DC
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
## 2015-04-16 DIAGNOSIS — M542 Cervicalgia: Secondary | ICD-10-CM | POA: Diagnosis not present

## 2015-04-16 DIAGNOSIS — K219 Gastro-esophageal reflux disease without esophagitis: Secondary | ICD-10-CM | POA: Diagnosis not present

## 2015-04-16 DIAGNOSIS — F329 Major depressive disorder, single episode, unspecified: Secondary | ICD-10-CM | POA: Diagnosis not present

## 2015-04-16 DIAGNOSIS — Z1389 Encounter for screening for other disorder: Secondary | ICD-10-CM | POA: Diagnosis not present

## 2015-04-16 DIAGNOSIS — E785 Hyperlipidemia, unspecified: Secondary | ICD-10-CM | POA: Diagnosis not present

## 2015-04-16 DIAGNOSIS — R7989 Other specified abnormal findings of blood chemistry: Secondary | ICD-10-CM | POA: Diagnosis not present

## 2015-04-16 DIAGNOSIS — Z6825 Body mass index (BMI) 25.0-25.9, adult: Secondary | ICD-10-CM | POA: Diagnosis not present

## 2015-04-16 DIAGNOSIS — I10 Essential (primary) hypertension: Secondary | ICD-10-CM | POA: Diagnosis not present

## 2015-04-29 DIAGNOSIS — J0100 Acute maxillary sinusitis, unspecified: Secondary | ICD-10-CM | POA: Diagnosis not present

## 2015-04-29 DIAGNOSIS — F329 Major depressive disorder, single episode, unspecified: Secondary | ICD-10-CM | POA: Diagnosis not present

## 2015-04-29 DIAGNOSIS — Z6826 Body mass index (BMI) 26.0-26.9, adult: Secondary | ICD-10-CM | POA: Diagnosis not present

## 2015-04-29 DIAGNOSIS — K219 Gastro-esophageal reflux disease without esophagitis: Secondary | ICD-10-CM | POA: Diagnosis not present

## 2015-04-29 DIAGNOSIS — I10 Essential (primary) hypertension: Secondary | ICD-10-CM | POA: Diagnosis not present

## 2015-04-29 DIAGNOSIS — L719 Rosacea, unspecified: Secondary | ICD-10-CM | POA: Diagnosis not present

## 2015-04-29 DIAGNOSIS — R7989 Other specified abnormal findings of blood chemistry: Secondary | ICD-10-CM | POA: Diagnosis not present

## 2015-04-29 DIAGNOSIS — E785 Hyperlipidemia, unspecified: Secondary | ICD-10-CM | POA: Diagnosis not present

## 2015-04-29 DIAGNOSIS — M542 Cervicalgia: Secondary | ICD-10-CM | POA: Diagnosis not present

## 2015-06-23 DIAGNOSIS — R785 Finding of other psychotropic drug in blood: Secondary | ICD-10-CM | POA: Diagnosis not present

## 2015-06-23 DIAGNOSIS — R945 Abnormal results of liver function studies: Secondary | ICD-10-CM | POA: Diagnosis not present

## 2015-06-23 DIAGNOSIS — K219 Gastro-esophageal reflux disease without esophagitis: Secondary | ICD-10-CM | POA: Diagnosis not present

## 2015-06-23 DIAGNOSIS — Z6826 Body mass index (BMI) 26.0-26.9, adult: Secondary | ICD-10-CM | POA: Diagnosis not present

## 2015-06-23 DIAGNOSIS — F329 Major depressive disorder, single episode, unspecified: Secondary | ICD-10-CM | POA: Diagnosis not present

## 2015-06-23 DIAGNOSIS — I10 Essential (primary) hypertension: Secondary | ICD-10-CM | POA: Diagnosis not present

## 2015-06-23 DIAGNOSIS — M542 Cervicalgia: Secondary | ICD-10-CM | POA: Diagnosis not present

## 2015-07-09 ENCOUNTER — Ambulatory Visit (HOSPITAL_COMMUNITY): Payer: Self-pay | Admitting: Psychiatry

## 2015-07-15 ENCOUNTER — Encounter (HOSPITAL_COMMUNITY): Payer: Self-pay | Admitting: Psychiatry

## 2015-07-15 ENCOUNTER — Ambulatory Visit (INDEPENDENT_AMBULATORY_CARE_PROVIDER_SITE_OTHER): Payer: Medicare Other | Admitting: Psychiatry

## 2015-07-15 VITALS — BP 120/78 | HR 78 | Ht 69.0 in | Wt 184.2 lb

## 2015-07-15 DIAGNOSIS — F251 Schizoaffective disorder, depressive type: Secondary | ICD-10-CM | POA: Diagnosis not present

## 2015-07-15 MED ORDER — ARIPIPRAZOLE 10 MG PO TABS: 10.0000 mg | ORAL_TABLET | Freq: Every day | ORAL | Status: DC
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
## 2015-07-29 DIAGNOSIS — L719 Rosacea, unspecified: Secondary | ICD-10-CM | POA: Diagnosis not present

## 2015-08-21 DIAGNOSIS — R945 Abnormal results of liver function studies: Secondary | ICD-10-CM | POA: Diagnosis not present

## 2015-08-21 DIAGNOSIS — I10 Essential (primary) hypertension: Secondary | ICD-10-CM | POA: Diagnosis not present

## 2015-08-21 DIAGNOSIS — E785 Hyperlipidemia, unspecified: Secondary | ICD-10-CM | POA: Diagnosis not present

## 2015-08-21 DIAGNOSIS — E663 Overweight: Secondary | ICD-10-CM | POA: Diagnosis not present

## 2015-08-21 DIAGNOSIS — F329 Major depressive disorder, single episode, unspecified: Secondary | ICD-10-CM | POA: Diagnosis not present

## 2015-08-21 DIAGNOSIS — M542 Cervicalgia: Secondary | ICD-10-CM | POA: Diagnosis not present

## 2015-08-21 DIAGNOSIS — K219 Gastro-esophageal reflux disease without esophagitis: Secondary | ICD-10-CM | POA: Diagnosis not present

## 2015-08-21 DIAGNOSIS — Z6827 Body mass index (BMI) 27.0-27.9, adult: Secondary | ICD-10-CM | POA: Diagnosis not present

## 2015-10-14 ENCOUNTER — Encounter (HOSPITAL_COMMUNITY): Payer: Self-pay | Admitting: Psychiatry

## 2015-10-14 ENCOUNTER — Ambulatory Visit (INDEPENDENT_AMBULATORY_CARE_PROVIDER_SITE_OTHER): Payer: Medicare Other | Admitting: Psychiatry

## 2015-10-14 VITALS — BP 122/76 | HR 86 | Ht 68.5 in | Wt 198.6 lb

## 2015-10-14 DIAGNOSIS — F251 Schizoaffective disorder, depressive type: Secondary | ICD-10-CM

## 2015-10-14 MED ORDER — ARIPIPRAZOLE 10 MG PO TABS: 10.0000 mg | ORAL_TABLET | Freq: Every day | ORAL | Status: DC
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
## 2015-10-21 DIAGNOSIS — E663 Overweight: Secondary | ICD-10-CM | POA: Diagnosis not present

## 2015-10-21 DIAGNOSIS — M542 Cervicalgia: Secondary | ICD-10-CM | POA: Diagnosis not present

## 2015-10-21 DIAGNOSIS — K219 Gastro-esophageal reflux disease without esophagitis: Secondary | ICD-10-CM | POA: Diagnosis not present

## 2015-10-21 DIAGNOSIS — I10 Essential (primary) hypertension: Secondary | ICD-10-CM | POA: Diagnosis not present

## 2015-10-21 DIAGNOSIS — Z6829 Body mass index (BMI) 29.0-29.9, adult: Secondary | ICD-10-CM | POA: Diagnosis not present

## 2015-10-21 DIAGNOSIS — F329 Major depressive disorder, single episode, unspecified: Secondary | ICD-10-CM | POA: Diagnosis not present

## 2015-10-21 DIAGNOSIS — R945 Abnormal results of liver function studies: Secondary | ICD-10-CM | POA: Diagnosis not present

## 2015-10-21 DIAGNOSIS — E785 Hyperlipidemia, unspecified: Secondary | ICD-10-CM | POA: Diagnosis not present

## 2015-12-22 DIAGNOSIS — K219 Gastro-esophageal reflux disease without esophagitis: Secondary | ICD-10-CM | POA: Diagnosis not present

## 2015-12-22 DIAGNOSIS — E785 Hyperlipidemia, unspecified: Secondary | ICD-10-CM | POA: Diagnosis not present

## 2015-12-22 DIAGNOSIS — Z6830 Body mass index (BMI) 30.0-30.9, adult: Secondary | ICD-10-CM | POA: Diagnosis not present

## 2015-12-22 DIAGNOSIS — R7989 Other specified abnormal findings of blood chemistry: Secondary | ICD-10-CM | POA: Diagnosis not present

## 2015-12-22 DIAGNOSIS — I10 Essential (primary) hypertension: Secondary | ICD-10-CM | POA: Diagnosis not present

## 2015-12-22 DIAGNOSIS — M542 Cervicalgia: Secondary | ICD-10-CM | POA: Diagnosis not present

## 2015-12-22 DIAGNOSIS — F329 Major depressive disorder, single episode, unspecified: Secondary | ICD-10-CM | POA: Diagnosis not present

## 2015-12-22 DIAGNOSIS — E669 Obesity, unspecified: Secondary | ICD-10-CM | POA: Diagnosis not present

## 2016-01-13 DIAGNOSIS — F329 Major depressive disorder, single episode, unspecified: Secondary | ICD-10-CM | POA: Diagnosis not present

## 2016-01-13 DIAGNOSIS — Z6831 Body mass index (BMI) 31.0-31.9, adult: Secondary | ICD-10-CM | POA: Diagnosis not present

## 2016-01-13 DIAGNOSIS — I10 Essential (primary) hypertension: Secondary | ICD-10-CM | POA: Diagnosis not present

## 2016-01-13 DIAGNOSIS — K219 Gastro-esophageal reflux disease without esophagitis: Secondary | ICD-10-CM | POA: Diagnosis not present

## 2016-01-13 DIAGNOSIS — M542 Cervicalgia: Secondary | ICD-10-CM | POA: Diagnosis not present

## 2016-01-13 DIAGNOSIS — E669 Obesity, unspecified: Secondary | ICD-10-CM | POA: Diagnosis not present

## 2016-01-13 DIAGNOSIS — E785 Hyperlipidemia, unspecified: Secondary | ICD-10-CM | POA: Diagnosis not present

## 2016-01-13 DIAGNOSIS — R7989 Other specified abnormal findings of blood chemistry: Secondary | ICD-10-CM | POA: Diagnosis not present

## 2016-01-13 DIAGNOSIS — R0789 Other chest pain: Secondary | ICD-10-CM | POA: Diagnosis not present

## 2016-01-15 ENCOUNTER — Ambulatory Visit (HOSPITAL_COMMUNITY): Payer: Self-pay | Admitting: Psychiatry

## 2016-01-27 DIAGNOSIS — B351 Tinea unguium: Secondary | ICD-10-CM | POA: Diagnosis not present

## 2016-01-27 DIAGNOSIS — L719 Rosacea, unspecified: Secondary | ICD-10-CM | POA: Diagnosis not present

## 2016-01-28 DIAGNOSIS — R0789 Other chest pain: Secondary | ICD-10-CM | POA: Diagnosis not present

## 2016-01-28 DIAGNOSIS — I10 Essential (primary) hypertension: Secondary | ICD-10-CM | POA: Diagnosis not present

## 2016-01-28 DIAGNOSIS — Z6830 Body mass index (BMI) 30.0-30.9, adult: Secondary | ICD-10-CM | POA: Diagnosis not present

## 2016-01-28 DIAGNOSIS — E785 Hyperlipidemia, unspecified: Secondary | ICD-10-CM | POA: Diagnosis not present

## 2016-01-28 DIAGNOSIS — K219 Gastro-esophageal reflux disease without esophagitis: Secondary | ICD-10-CM | POA: Diagnosis not present

## 2016-01-28 DIAGNOSIS — E669 Obesity, unspecified: Secondary | ICD-10-CM | POA: Diagnosis not present

## 2016-01-28 DIAGNOSIS — M542 Cervicalgia: Secondary | ICD-10-CM | POA: Diagnosis not present

## 2016-01-28 DIAGNOSIS — F329 Major depressive disorder, single episode, unspecified: Secondary | ICD-10-CM | POA: Diagnosis not present

## 2016-01-28 DIAGNOSIS — R7989 Other specified abnormal findings of blood chemistry: Secondary | ICD-10-CM | POA: Diagnosis not present

## 2016-02-02 ENCOUNTER — Other Ambulatory Visit (HOSPITAL_COMMUNITY): Payer: Self-pay | Admitting: Psychiatry

## 2016-02-02 DIAGNOSIS — F251 Schizoaffective disorder, depressive type: Secondary | ICD-10-CM

## 2016-02-11 DIAGNOSIS — B351 Tinea unguium: Secondary | ICD-10-CM | POA: Diagnosis not present

## 2016-02-16 ENCOUNTER — Ambulatory Visit (HOSPITAL_COMMUNITY): Payer: Self-pay | Admitting: Psychiatry

## 2016-02-24 ENCOUNTER — Ambulatory Visit (INDEPENDENT_AMBULATORY_CARE_PROVIDER_SITE_OTHER): Payer: Medicare Other | Admitting: Psychiatry

## 2016-02-24 ENCOUNTER — Encounter (HOSPITAL_COMMUNITY): Payer: Self-pay | Admitting: Psychiatry

## 2016-02-24 DIAGNOSIS — F251 Schizoaffective disorder, depressive type: Secondary | ICD-10-CM | POA: Diagnosis not present

## 2016-02-24 MED ORDER — ARIPIPRAZOLE 10 MG PO TABS: 10.0000 mg | ORAL_TABLET | Freq: Every day | ORAL | 2 refills | Status: DC
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
## 2016-03-02 DIAGNOSIS — E669 Obesity, unspecified: Secondary | ICD-10-CM | POA: Diagnosis not present

## 2016-03-02 DIAGNOSIS — Z6831 Body mass index (BMI) 31.0-31.9, adult: Secondary | ICD-10-CM | POA: Diagnosis not present

## 2016-03-02 DIAGNOSIS — R0789 Other chest pain: Secondary | ICD-10-CM | POA: Diagnosis not present

## 2016-03-02 DIAGNOSIS — K219 Gastro-esophageal reflux disease without esophagitis: Secondary | ICD-10-CM | POA: Diagnosis not present

## 2016-03-02 DIAGNOSIS — Z23 Encounter for immunization: Secondary | ICD-10-CM | POA: Diagnosis not present

## 2016-03-02 DIAGNOSIS — E785 Hyperlipidemia, unspecified: Secondary | ICD-10-CM | POA: Diagnosis not present

## 2016-03-02 DIAGNOSIS — R7989 Other specified abnormal findings of blood chemistry: Secondary | ICD-10-CM | POA: Diagnosis not present

## 2016-03-02 DIAGNOSIS — I10 Essential (primary) hypertension: Secondary | ICD-10-CM | POA: Diagnosis not present

## 2016-03-02 DIAGNOSIS — M542 Cervicalgia: Secondary | ICD-10-CM | POA: Diagnosis not present

## 2016-03-02 DIAGNOSIS — F329 Major depressive disorder, single episode, unspecified: Secondary | ICD-10-CM | POA: Diagnosis not present

## 2016-04-07 DIAGNOSIS — M79673 Pain in unspecified foot: Secondary | ICD-10-CM

## 2016-05-03 DIAGNOSIS — E785 Hyperlipidemia, unspecified: Secondary | ICD-10-CM | POA: Diagnosis not present

## 2016-05-03 DIAGNOSIS — K219 Gastro-esophageal reflux disease without esophagitis: Secondary | ICD-10-CM | POA: Diagnosis not present

## 2016-05-03 DIAGNOSIS — F329 Major depressive disorder, single episode, unspecified: Secondary | ICD-10-CM | POA: Diagnosis not present

## 2016-05-03 DIAGNOSIS — R0789 Other chest pain: Secondary | ICD-10-CM | POA: Diagnosis not present

## 2016-05-03 DIAGNOSIS — R7989 Other specified abnormal findings of blood chemistry: Secondary | ICD-10-CM | POA: Diagnosis not present

## 2016-05-03 DIAGNOSIS — M542 Cervicalgia: Secondary | ICD-10-CM | POA: Diagnosis not present

## 2016-05-03 DIAGNOSIS — Z6831 Body mass index (BMI) 31.0-31.9, adult: Secondary | ICD-10-CM | POA: Diagnosis not present

## 2016-05-03 DIAGNOSIS — I10 Essential (primary) hypertension: Secondary | ICD-10-CM | POA: Diagnosis not present

## 2016-05-03 DIAGNOSIS — E669 Obesity, unspecified: Secondary | ICD-10-CM | POA: Diagnosis not present

## 2016-05-13 DIAGNOSIS — K219 Gastro-esophageal reflux disease without esophagitis: Secondary | ICD-10-CM | POA: Diagnosis not present

## 2016-05-13 DIAGNOSIS — Z6831 Body mass index (BMI) 31.0-31.9, adult: Secondary | ICD-10-CM | POA: Diagnosis not present

## 2016-05-13 DIAGNOSIS — F329 Major depressive disorder, single episode, unspecified: Secondary | ICD-10-CM | POA: Diagnosis not present

## 2016-05-13 DIAGNOSIS — J029 Acute pharyngitis, unspecified: Secondary | ICD-10-CM | POA: Diagnosis not present

## 2016-05-13 DIAGNOSIS — R7989 Other specified abnormal findings of blood chemistry: Secondary | ICD-10-CM | POA: Diagnosis not present

## 2016-05-13 DIAGNOSIS — E669 Obesity, unspecified: Secondary | ICD-10-CM | POA: Diagnosis not present

## 2016-05-13 DIAGNOSIS — M542 Cervicalgia: Secondary | ICD-10-CM | POA: Diagnosis not present

## 2016-05-13 DIAGNOSIS — R0789 Other chest pain: Secondary | ICD-10-CM | POA: Diagnosis not present

## 2016-05-13 DIAGNOSIS — E785 Hyperlipidemia, unspecified: Secondary | ICD-10-CM | POA: Diagnosis not present

## 2016-05-13 DIAGNOSIS — I10 Essential (primary) hypertension: Secondary | ICD-10-CM | POA: Diagnosis not present

## 2016-05-18 ENCOUNTER — Encounter (HOSPITAL_COMMUNITY): Payer: Self-pay | Admitting: Psychiatry

## 2016-05-18 ENCOUNTER — Ambulatory Visit (INDEPENDENT_AMBULATORY_CARE_PROVIDER_SITE_OTHER): Payer: Medicare Other | Admitting: Psychiatry

## 2016-05-18 DIAGNOSIS — Z79899 Other long term (current) drug therapy: Secondary | ICD-10-CM | POA: Diagnosis not present

## 2016-05-18 DIAGNOSIS — F251 Schizoaffective disorder, depressive type: Secondary | ICD-10-CM | POA: Diagnosis not present

## 2016-05-18 MED ORDER — ARIPIPRAZOLE 10 MG PO TABS: 10.0000 mg | ORAL_TABLET | Freq: Every day | ORAL | 2 refills | Status: DC
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
## 2016-06-02 ENCOUNTER — Encounter: Payer: Self-pay | Admitting: Psychiatry

## 2016-06-29 DIAGNOSIS — F329 Major depressive disorder, single episode, unspecified: Secondary | ICD-10-CM | POA: Diagnosis not present

## 2016-06-29 DIAGNOSIS — E669 Obesity, unspecified: Secondary | ICD-10-CM | POA: Diagnosis not present

## 2016-06-29 DIAGNOSIS — I10 Essential (primary) hypertension: Secondary | ICD-10-CM | POA: Diagnosis not present

## 2016-06-29 DIAGNOSIS — E785 Hyperlipidemia, unspecified: Secondary | ICD-10-CM | POA: Diagnosis not present

## 2016-06-29 DIAGNOSIS — K219 Gastro-esophageal reflux disease without esophagitis: Secondary | ICD-10-CM | POA: Diagnosis not present

## 2016-06-29 DIAGNOSIS — M542 Cervicalgia: Secondary | ICD-10-CM | POA: Diagnosis not present

## 2016-06-29 DIAGNOSIS — R7989 Other specified abnormal findings of blood chemistry: Secondary | ICD-10-CM | POA: Diagnosis not present

## 2016-06-29 DIAGNOSIS — J028 Acute pharyngitis due to other specified organisms: Secondary | ICD-10-CM | POA: Diagnosis not present

## 2016-06-29 DIAGNOSIS — R0789 Other chest pain: Secondary | ICD-10-CM | POA: Diagnosis not present

## 2016-06-29 DIAGNOSIS — R6889 Other general symptoms and signs: Secondary | ICD-10-CM | POA: Diagnosis not present

## 2016-06-29 DIAGNOSIS — Z6832 Body mass index (BMI) 32.0-32.9, adult: Secondary | ICD-10-CM | POA: Diagnosis not present

## 2016-07-01 DIAGNOSIS — L719 Rosacea, unspecified: Secondary | ICD-10-CM | POA: Diagnosis not present

## 2016-07-01 DIAGNOSIS — D485 Neoplasm of uncertain behavior of skin: Secondary | ICD-10-CM | POA: Diagnosis not present

## 2016-07-05 DIAGNOSIS — E669 Obesity, unspecified: Secondary | ICD-10-CM | POA: Diagnosis not present

## 2016-07-05 DIAGNOSIS — Z6832 Body mass index (BMI) 32.0-32.9, adult: Secondary | ICD-10-CM | POA: Diagnosis not present

## 2016-07-05 DIAGNOSIS — R7989 Other specified abnormal findings of blood chemistry: Secondary | ICD-10-CM | POA: Diagnosis not present

## 2016-07-05 DIAGNOSIS — R0789 Other chest pain: Secondary | ICD-10-CM | POA: Diagnosis not present

## 2016-07-05 DIAGNOSIS — F329 Major depressive disorder, single episode, unspecified: Secondary | ICD-10-CM | POA: Diagnosis not present

## 2016-07-05 DIAGNOSIS — M542 Cervicalgia: Secondary | ICD-10-CM | POA: Diagnosis not present

## 2016-07-05 DIAGNOSIS — E785 Hyperlipidemia, unspecified: Secondary | ICD-10-CM | POA: Diagnosis not present

## 2016-07-05 DIAGNOSIS — I10 Essential (primary) hypertension: Secondary | ICD-10-CM | POA: Diagnosis not present

## 2016-07-05 DIAGNOSIS — K219 Gastro-esophageal reflux disease without esophagitis: Secondary | ICD-10-CM | POA: Diagnosis not present

## 2016-07-05 DIAGNOSIS — Z1389 Encounter for screening for other disorder: Secondary | ICD-10-CM | POA: Diagnosis not present

## 2016-08-17 ENCOUNTER — Ambulatory Visit (HOSPITAL_COMMUNITY): Payer: Self-pay | Admitting: Psychiatry

## 2016-08-25 DIAGNOSIS — K644 Residual hemorrhoidal skin tags: Secondary | ICD-10-CM | POA: Diagnosis not present

## 2016-08-25 DIAGNOSIS — K210 Gastro-esophageal reflux disease with esophagitis: Secondary | ICD-10-CM | POA: Diagnosis not present

## 2016-09-01 DIAGNOSIS — F341 Dysthymic disorder: Secondary | ICD-10-CM | POA: Diagnosis not present

## 2016-09-01 DIAGNOSIS — I10 Essential (primary) hypertension: Secondary | ICD-10-CM | POA: Diagnosis not present

## 2016-09-01 DIAGNOSIS — R0789 Other chest pain: Secondary | ICD-10-CM | POA: Diagnosis not present

## 2016-09-01 DIAGNOSIS — R600 Localized edema: Secondary | ICD-10-CM | POA: Diagnosis not present

## 2016-09-01 DIAGNOSIS — K219 Gastro-esophageal reflux disease without esophagitis: Secondary | ICD-10-CM | POA: Diagnosis not present

## 2016-09-01 DIAGNOSIS — E669 Obesity, unspecified: Secondary | ICD-10-CM | POA: Diagnosis not present

## 2016-09-01 DIAGNOSIS — M542 Cervicalgia: Secondary | ICD-10-CM | POA: Diagnosis not present

## 2016-09-01 DIAGNOSIS — Z6832 Body mass index (BMI) 32.0-32.9, adult: Secondary | ICD-10-CM | POA: Diagnosis not present

## 2016-09-01 DIAGNOSIS — E785 Hyperlipidemia, unspecified: Secondary | ICD-10-CM | POA: Diagnosis not present

## 2016-09-01 DIAGNOSIS — R7989 Other specified abnormal findings of blood chemistry: Secondary | ICD-10-CM | POA: Diagnosis not present

## 2016-09-08 ENCOUNTER — Ambulatory Visit: Payer: Self-pay | Admitting: Allergy and Immunology

## 2016-09-10 DIAGNOSIS — K219 Gastro-esophageal reflux disease without esophagitis: Secondary | ICD-10-CM | POA: Diagnosis not present

## 2016-09-10 DIAGNOSIS — M542 Cervicalgia: Secondary | ICD-10-CM | POA: Diagnosis not present

## 2016-09-10 DIAGNOSIS — E785 Hyperlipidemia, unspecified: Secondary | ICD-10-CM | POA: Diagnosis not present

## 2016-09-10 DIAGNOSIS — E669 Obesity, unspecified: Secondary | ICD-10-CM | POA: Diagnosis not present

## 2016-09-10 DIAGNOSIS — I10 Essential (primary) hypertension: Secondary | ICD-10-CM | POA: Diagnosis not present

## 2016-09-10 DIAGNOSIS — R600 Localized edema: Secondary | ICD-10-CM | POA: Diagnosis not present

## 2016-09-10 DIAGNOSIS — R0789 Other chest pain: Secondary | ICD-10-CM | POA: Diagnosis not present

## 2016-09-10 DIAGNOSIS — R7989 Other specified abnormal findings of blood chemistry: Secondary | ICD-10-CM | POA: Diagnosis not present

## 2016-09-10 DIAGNOSIS — Z6832 Body mass index (BMI) 32.0-32.9, adult: Secondary | ICD-10-CM | POA: Diagnosis not present

## 2016-09-10 DIAGNOSIS — F341 Dysthymic disorder: Secondary | ICD-10-CM | POA: Diagnosis not present

## 2016-09-13 ENCOUNTER — Encounter (HOSPITAL_COMMUNITY): Payer: Self-pay | Admitting: Psychiatry

## 2016-09-13 ENCOUNTER — Ambulatory Visit (INDEPENDENT_AMBULATORY_CARE_PROVIDER_SITE_OTHER): Payer: PPO | Admitting: Psychiatry

## 2016-09-13 DIAGNOSIS — Z87891 Personal history of nicotine dependence: Secondary | ICD-10-CM

## 2016-09-13 DIAGNOSIS — F251 Schizoaffective disorder, depressive type: Secondary | ICD-10-CM

## 2016-09-13 DIAGNOSIS — Z818 Family history of other mental and behavioral disorders: Secondary | ICD-10-CM | POA: Diagnosis not present

## 2016-09-13 DIAGNOSIS — Z79899 Other long term (current) drug therapy: Secondary | ICD-10-CM

## 2016-09-13 MED ORDER — ARIPIPRAZOLE 10 MG PO TABS: 10.0000 mg | ORAL_TABLET | Freq: Every day | ORAL | 2 refills | Status: DC
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
## 2016-09-15 DIAGNOSIS — R600 Localized edema: Secondary | ICD-10-CM | POA: Diagnosis not present

## 2016-09-15 DIAGNOSIS — K219 Gastro-esophageal reflux disease without esophagitis: Secondary | ICD-10-CM | POA: Diagnosis not present

## 2016-09-15 DIAGNOSIS — E785 Hyperlipidemia, unspecified: Secondary | ICD-10-CM | POA: Diagnosis not present

## 2016-09-15 DIAGNOSIS — M542 Cervicalgia: Secondary | ICD-10-CM | POA: Diagnosis not present

## 2016-09-15 DIAGNOSIS — R0789 Other chest pain: Secondary | ICD-10-CM | POA: Diagnosis not present

## 2016-09-15 DIAGNOSIS — Z6832 Body mass index (BMI) 32.0-32.9, adult: Secondary | ICD-10-CM | POA: Diagnosis not present

## 2016-09-15 DIAGNOSIS — E669 Obesity, unspecified: Secondary | ICD-10-CM | POA: Diagnosis not present

## 2016-09-15 DIAGNOSIS — F341 Dysthymic disorder: Secondary | ICD-10-CM | POA: Diagnosis not present

## 2016-09-15 DIAGNOSIS — I10 Essential (primary) hypertension: Secondary | ICD-10-CM | POA: Diagnosis not present

## 2016-09-15 DIAGNOSIS — J028 Acute pharyngitis due to other specified organisms: Secondary | ICD-10-CM | POA: Diagnosis not present

## 2016-09-15 DIAGNOSIS — R7989 Other specified abnormal findings of blood chemistry: Secondary | ICD-10-CM | POA: Diagnosis not present

## 2016-09-21 DIAGNOSIS — R7989 Other specified abnormal findings of blood chemistry: Secondary | ICD-10-CM | POA: Diagnosis not present

## 2016-09-21 DIAGNOSIS — Z6833 Body mass index (BMI) 33.0-33.9, adult: Secondary | ICD-10-CM | POA: Diagnosis not present

## 2016-09-21 DIAGNOSIS — F341 Dysthymic disorder: Secondary | ICD-10-CM | POA: Diagnosis not present

## 2016-09-21 DIAGNOSIS — J028 Acute pharyngitis due to other specified organisms: Secondary | ICD-10-CM | POA: Diagnosis not present

## 2016-09-21 DIAGNOSIS — K219 Gastro-esophageal reflux disease without esophagitis: Secondary | ICD-10-CM | POA: Diagnosis not present

## 2016-09-21 DIAGNOSIS — I10 Essential (primary) hypertension: Secondary | ICD-10-CM | POA: Diagnosis not present

## 2016-09-21 DIAGNOSIS — M542 Cervicalgia: Secondary | ICD-10-CM | POA: Diagnosis not present

## 2016-09-21 DIAGNOSIS — R600 Localized edema: Secondary | ICD-10-CM | POA: Diagnosis not present

## 2016-09-21 DIAGNOSIS — E785 Hyperlipidemia, unspecified: Secondary | ICD-10-CM | POA: Diagnosis not present

## 2016-09-21 DIAGNOSIS — R0789 Other chest pain: Secondary | ICD-10-CM | POA: Diagnosis not present

## 2016-09-23 DIAGNOSIS — H25013 Cortical age-related cataract, bilateral: Secondary | ICD-10-CM | POA: Diagnosis not present

## 2016-09-23 DIAGNOSIS — H524 Presbyopia: Secondary | ICD-10-CM | POA: Diagnosis not present

## 2016-10-04 DIAGNOSIS — K210 Gastro-esophageal reflux disease with esophagitis: Secondary | ICD-10-CM | POA: Diagnosis not present

## 2016-10-04 DIAGNOSIS — K644 Residual hemorrhoidal skin tags: Secondary | ICD-10-CM | POA: Diagnosis not present

## 2016-10-08 DIAGNOSIS — Z6832 Body mass index (BMI) 32.0-32.9, adult: Secondary | ICD-10-CM | POA: Diagnosis not present

## 2016-10-08 DIAGNOSIS — Z0000 Encounter for general adult medical examination without abnormal findings: Secondary | ICD-10-CM | POA: Diagnosis not present

## 2016-10-08 DIAGNOSIS — E785 Hyperlipidemia, unspecified: Secondary | ICD-10-CM | POA: Diagnosis not present

## 2016-10-19 DIAGNOSIS — Z6832 Body mass index (BMI) 32.0-32.9, adult: Secondary | ICD-10-CM | POA: Diagnosis not present

## 2016-10-19 DIAGNOSIS — R0789 Other chest pain: Secondary | ICD-10-CM | POA: Diagnosis not present

## 2016-10-19 DIAGNOSIS — E669 Obesity, unspecified: Secondary | ICD-10-CM | POA: Diagnosis not present

## 2016-10-19 DIAGNOSIS — K219 Gastro-esophageal reflux disease without esophagitis: Secondary | ICD-10-CM | POA: Diagnosis not present

## 2016-10-19 DIAGNOSIS — M542 Cervicalgia: Secondary | ICD-10-CM | POA: Diagnosis not present

## 2016-10-19 DIAGNOSIS — E785 Hyperlipidemia, unspecified: Secondary | ICD-10-CM | POA: Diagnosis not present

## 2016-10-19 DIAGNOSIS — R600 Localized edema: Secondary | ICD-10-CM | POA: Diagnosis not present

## 2016-10-19 DIAGNOSIS — I10 Essential (primary) hypertension: Secondary | ICD-10-CM | POA: Diagnosis not present

## 2016-10-19 DIAGNOSIS — R7989 Other specified abnormal findings of blood chemistry: Secondary | ICD-10-CM | POA: Diagnosis not present

## 2016-10-19 DIAGNOSIS — F341 Dysthymic disorder: Secondary | ICD-10-CM | POA: Diagnosis not present

## 2016-10-21 DIAGNOSIS — K449 Diaphragmatic hernia without obstruction or gangrene: Secondary | ICD-10-CM | POA: Diagnosis not present

## 2016-10-21 DIAGNOSIS — K219 Gastro-esophageal reflux disease without esophagitis: Secondary | ICD-10-CM | POA: Diagnosis not present

## 2016-10-21 DIAGNOSIS — Z800 Family history of malignant neoplasm of digestive organs: Secondary | ICD-10-CM | POA: Diagnosis not present

## 2016-10-21 DIAGNOSIS — I10 Essential (primary) hypertension: Secondary | ICD-10-CM | POA: Diagnosis not present

## 2016-10-21 DIAGNOSIS — K2270 Barrett's esophagus without dysplasia: Secondary | ICD-10-CM | POA: Diagnosis not present

## 2016-10-21 DIAGNOSIS — Z79899 Other long term (current) drug therapy: Secondary | ICD-10-CM | POA: Diagnosis not present

## 2016-10-21 DIAGNOSIS — K210 Gastro-esophageal reflux disease with esophagitis: Secondary | ICD-10-CM | POA: Diagnosis not present

## 2016-10-21 DIAGNOSIS — K208 Other esophagitis: Secondary | ICD-10-CM | POA: Diagnosis not present

## 2016-11-19 DIAGNOSIS — F341 Dysthymic disorder: Secondary | ICD-10-CM | POA: Diagnosis not present

## 2016-11-19 DIAGNOSIS — E669 Obesity, unspecified: Secondary | ICD-10-CM | POA: Diagnosis not present

## 2016-11-19 DIAGNOSIS — Z6832 Body mass index (BMI) 32.0-32.9, adult: Secondary | ICD-10-CM | POA: Diagnosis not present

## 2016-11-19 DIAGNOSIS — R600 Localized edema: Secondary | ICD-10-CM | POA: Diagnosis not present

## 2016-11-19 DIAGNOSIS — R7989 Other specified abnormal findings of blood chemistry: Secondary | ICD-10-CM | POA: Diagnosis not present

## 2016-11-19 DIAGNOSIS — R0789 Other chest pain: Secondary | ICD-10-CM | POA: Diagnosis not present

## 2016-11-19 DIAGNOSIS — E785 Hyperlipidemia, unspecified: Secondary | ICD-10-CM | POA: Diagnosis not present

## 2016-11-19 DIAGNOSIS — K219 Gastro-esophageal reflux disease without esophagitis: Secondary | ICD-10-CM | POA: Diagnosis not present

## 2016-11-19 DIAGNOSIS — M542 Cervicalgia: Secondary | ICD-10-CM | POA: Diagnosis not present

## 2016-11-19 DIAGNOSIS — I10 Essential (primary) hypertension: Secondary | ICD-10-CM | POA: Diagnosis not present

## 2016-11-24 DIAGNOSIS — K210 Gastro-esophageal reflux disease with esophagitis: Secondary | ICD-10-CM | POA: Diagnosis not present

## 2016-11-24 DIAGNOSIS — Z800 Family history of malignant neoplasm of digestive organs: Secondary | ICD-10-CM | POA: Diagnosis not present

## 2016-12-14 ENCOUNTER — Ambulatory Visit (HOSPITAL_COMMUNITY): Payer: Self-pay | Admitting: Psychiatry

## 2016-12-29 ENCOUNTER — Encounter (HOSPITAL_COMMUNITY): Payer: Self-pay | Admitting: Psychiatry

## 2016-12-29 ENCOUNTER — Ambulatory Visit (INDEPENDENT_AMBULATORY_CARE_PROVIDER_SITE_OTHER): Payer: PPO | Admitting: Psychiatry

## 2016-12-29 DIAGNOSIS — Z818 Family history of other mental and behavioral disorders: Secondary | ICD-10-CM | POA: Diagnosis not present

## 2016-12-29 DIAGNOSIS — M542 Cervicalgia: Secondary | ICD-10-CM | POA: Diagnosis not present

## 2016-12-29 DIAGNOSIS — Z87891 Personal history of nicotine dependence: Secondary | ICD-10-CM | POA: Diagnosis not present

## 2016-12-29 DIAGNOSIS — Z79899 Other long term (current) drug therapy: Secondary | ICD-10-CM

## 2016-12-29 DIAGNOSIS — R945 Abnormal results of liver function studies: Secondary | ICD-10-CM | POA: Diagnosis not present

## 2016-12-29 DIAGNOSIS — F251 Schizoaffective disorder, depressive type: Secondary | ICD-10-CM | POA: Diagnosis not present

## 2016-12-29 DIAGNOSIS — Z791 Long term (current) use of non-steroidal anti-inflammatories (NSAID): Secondary | ICD-10-CM | POA: Diagnosis not present

## 2016-12-29 DIAGNOSIS — F341 Dysthymic disorder: Secondary | ICD-10-CM | POA: Diagnosis not present

## 2016-12-29 DIAGNOSIS — I10 Essential (primary) hypertension: Secondary | ICD-10-CM | POA: Diagnosis not present

## 2016-12-29 DIAGNOSIS — E785 Hyperlipidemia, unspecified: Secondary | ICD-10-CM | POA: Diagnosis not present

## 2016-12-29 DIAGNOSIS — K219 Gastro-esophageal reflux disease without esophagitis: Secondary | ICD-10-CM | POA: Diagnosis not present

## 2016-12-29 DIAGNOSIS — R600 Localized edema: Secondary | ICD-10-CM | POA: Diagnosis not present

## 2016-12-29 DIAGNOSIS — Z6832 Body mass index (BMI) 32.0-32.9, adult: Secondary | ICD-10-CM | POA: Diagnosis not present

## 2016-12-29 DIAGNOSIS — R0789 Other chest pain: Secondary | ICD-10-CM | POA: Diagnosis not present

## 2016-12-29 MED ORDER — ARIPIPRAZOLE 10 MG PO TABS: 10.0000 mg | ORAL_TABLET | Freq: Every day | ORAL | 2 refills | Status: DC
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
## 2016-12-30 DIAGNOSIS — L719 Rosacea, unspecified: Secondary | ICD-10-CM | POA: Diagnosis not present

## 2017-01-12 DIAGNOSIS — E785 Hyperlipidemia, unspecified: Secondary | ICD-10-CM | POA: Diagnosis not present

## 2017-01-12 DIAGNOSIS — R600 Localized edema: Secondary | ICD-10-CM | POA: Diagnosis not present

## 2017-01-12 DIAGNOSIS — I10 Essential (primary) hypertension: Secondary | ICD-10-CM | POA: Diagnosis not present

## 2017-01-12 DIAGNOSIS — F341 Dysthymic disorder: Secondary | ICD-10-CM | POA: Diagnosis not present

## 2017-01-12 DIAGNOSIS — R0789 Other chest pain: Secondary | ICD-10-CM | POA: Diagnosis not present

## 2017-01-12 DIAGNOSIS — M542 Cervicalgia: Secondary | ICD-10-CM | POA: Diagnosis not present

## 2017-01-12 DIAGNOSIS — R945 Abnormal results of liver function studies: Secondary | ICD-10-CM | POA: Diagnosis not present

## 2017-01-12 DIAGNOSIS — K219 Gastro-esophageal reflux disease without esophagitis: Secondary | ICD-10-CM | POA: Diagnosis not present

## 2017-01-12 DIAGNOSIS — Z6833 Body mass index (BMI) 33.0-33.9, adult: Secondary | ICD-10-CM | POA: Diagnosis not present

## 2017-02-09 DIAGNOSIS — E785 Hyperlipidemia, unspecified: Secondary | ICD-10-CM | POA: Diagnosis not present

## 2017-02-09 DIAGNOSIS — F341 Dysthymic disorder: Secondary | ICD-10-CM | POA: Diagnosis not present

## 2017-02-09 DIAGNOSIS — I10 Essential (primary) hypertension: Secondary | ICD-10-CM | POA: Diagnosis not present

## 2017-02-09 DIAGNOSIS — M542 Cervicalgia: Secondary | ICD-10-CM | POA: Diagnosis not present

## 2017-02-09 DIAGNOSIS — Z6833 Body mass index (BMI) 33.0-33.9, adult: Secondary | ICD-10-CM | POA: Diagnosis not present

## 2017-02-09 DIAGNOSIS — Z1389 Encounter for screening for other disorder: Secondary | ICD-10-CM | POA: Diagnosis not present

## 2017-02-09 DIAGNOSIS — R0789 Other chest pain: Secondary | ICD-10-CM | POA: Diagnosis not present

## 2017-02-09 DIAGNOSIS — K219 Gastro-esophageal reflux disease without esophagitis: Secondary | ICD-10-CM | POA: Diagnosis not present

## 2017-02-09 DIAGNOSIS — R600 Localized edema: Secondary | ICD-10-CM | POA: Diagnosis not present

## 2017-02-09 DIAGNOSIS — R945 Abnormal results of liver function studies: Secondary | ICD-10-CM | POA: Diagnosis not present

## 2017-03-09 DIAGNOSIS — M542 Cervicalgia: Secondary | ICD-10-CM | POA: Diagnosis not present

## 2017-03-09 DIAGNOSIS — F341 Dysthymic disorder: Secondary | ICD-10-CM | POA: Diagnosis not present

## 2017-03-09 DIAGNOSIS — R600 Localized edema: Secondary | ICD-10-CM | POA: Diagnosis not present

## 2017-03-09 DIAGNOSIS — Z6833 Body mass index (BMI) 33.0-33.9, adult: Secondary | ICD-10-CM | POA: Diagnosis not present

## 2017-03-09 DIAGNOSIS — Z23 Encounter for immunization: Secondary | ICD-10-CM | POA: Diagnosis not present

## 2017-03-09 DIAGNOSIS — R0789 Other chest pain: Secondary | ICD-10-CM | POA: Diagnosis not present

## 2017-03-09 DIAGNOSIS — I10 Essential (primary) hypertension: Secondary | ICD-10-CM | POA: Diagnosis not present

## 2017-03-09 DIAGNOSIS — K219 Gastro-esophageal reflux disease without esophagitis: Secondary | ICD-10-CM | POA: Diagnosis not present

## 2017-03-09 DIAGNOSIS — E785 Hyperlipidemia, unspecified: Secondary | ICD-10-CM | POA: Diagnosis not present

## 2017-03-09 DIAGNOSIS — R945 Abnormal results of liver function studies: Secondary | ICD-10-CM | POA: Diagnosis not present

## 2017-03-31 ENCOUNTER — Ambulatory Visit (HOSPITAL_COMMUNITY): Payer: Self-pay | Admitting: Psychiatry

## 2017-04-01 DIAGNOSIS — L918 Other hypertrophic disorders of the skin: Secondary | ICD-10-CM | POA: Diagnosis not present

## 2017-04-07 ENCOUNTER — Ambulatory Visit (HOSPITAL_COMMUNITY): Payer: PPO | Admitting: Psychiatry

## 2017-04-07 ENCOUNTER — Encounter (HOSPITAL_COMMUNITY): Payer: Self-pay | Admitting: Psychiatry

## 2017-04-07 DIAGNOSIS — R0789 Other chest pain: Secondary | ICD-10-CM | POA: Diagnosis not present

## 2017-04-07 DIAGNOSIS — F251 Schizoaffective disorder, depressive type: Secondary | ICD-10-CM

## 2017-04-07 DIAGNOSIS — Z6834 Body mass index (BMI) 34.0-34.9, adult: Secondary | ICD-10-CM | POA: Diagnosis not present

## 2017-04-07 DIAGNOSIS — E785 Hyperlipidemia, unspecified: Secondary | ICD-10-CM | POA: Diagnosis not present

## 2017-04-07 DIAGNOSIS — K219 Gastro-esophageal reflux disease without esophagitis: Secondary | ICD-10-CM | POA: Diagnosis not present

## 2017-04-07 DIAGNOSIS — F341 Dysthymic disorder: Secondary | ICD-10-CM | POA: Diagnosis not present

## 2017-04-07 DIAGNOSIS — R945 Abnormal results of liver function studies: Secondary | ICD-10-CM | POA: Diagnosis not present

## 2017-04-07 DIAGNOSIS — R600 Localized edema: Secondary | ICD-10-CM | POA: Diagnosis not present

## 2017-04-07 DIAGNOSIS — I10 Essential (primary) hypertension: Secondary | ICD-10-CM | POA: Diagnosis not present

## 2017-04-07 DIAGNOSIS — E669 Obesity, unspecified: Secondary | ICD-10-CM | POA: Diagnosis not present

## 2017-04-07 DIAGNOSIS — M542 Cervicalgia: Secondary | ICD-10-CM | POA: Diagnosis not present

## 2017-04-07 MED ORDER — ARIPIPRAZOLE 10 MG PO TABS: 10.0000 mg | ORAL_TABLET | Freq: Every day | ORAL | 2 refills | Status: DC
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
## 2017-04-28 DIAGNOSIS — M542 Cervicalgia: Secondary | ICD-10-CM | POA: Diagnosis not present

## 2017-04-28 DIAGNOSIS — F341 Dysthymic disorder: Secondary | ICD-10-CM | POA: Diagnosis not present

## 2017-04-28 DIAGNOSIS — Z6834 Body mass index (BMI) 34.0-34.9, adult: Secondary | ICD-10-CM | POA: Diagnosis not present

## 2017-04-28 DIAGNOSIS — R0789 Other chest pain: Secondary | ICD-10-CM | POA: Diagnosis not present

## 2017-04-28 DIAGNOSIS — K219 Gastro-esophageal reflux disease without esophagitis: Secondary | ICD-10-CM | POA: Diagnosis not present

## 2017-04-28 DIAGNOSIS — R945 Abnormal results of liver function studies: Secondary | ICD-10-CM | POA: Diagnosis not present

## 2017-04-28 DIAGNOSIS — E785 Hyperlipidemia, unspecified: Secondary | ICD-10-CM | POA: Diagnosis not present

## 2017-04-28 DIAGNOSIS — I10 Essential (primary) hypertension: Secondary | ICD-10-CM | POA: Diagnosis not present

## 2017-04-28 DIAGNOSIS — R600 Localized edema: Secondary | ICD-10-CM | POA: Diagnosis not present

## 2017-05-03 DIAGNOSIS — L300 Nummular dermatitis: Secondary | ICD-10-CM | POA: Diagnosis not present

## 2017-05-10 DIAGNOSIS — R945 Abnormal results of liver function studies: Secondary | ICD-10-CM | POA: Diagnosis not present

## 2017-05-10 DIAGNOSIS — M542 Cervicalgia: Secondary | ICD-10-CM | POA: Diagnosis not present

## 2017-05-10 DIAGNOSIS — Z6835 Body mass index (BMI) 35.0-35.9, adult: Secondary | ICD-10-CM | POA: Diagnosis not present

## 2017-05-10 DIAGNOSIS — I10 Essential (primary) hypertension: Secondary | ICD-10-CM | POA: Diagnosis not present

## 2017-05-10 DIAGNOSIS — R600 Localized edema: Secondary | ICD-10-CM | POA: Diagnosis not present

## 2017-05-10 DIAGNOSIS — F341 Dysthymic disorder: Secondary | ICD-10-CM | POA: Diagnosis not present

## 2017-05-10 DIAGNOSIS — R0789 Other chest pain: Secondary | ICD-10-CM | POA: Diagnosis not present

## 2017-05-10 DIAGNOSIS — K219 Gastro-esophageal reflux disease without esophagitis: Secondary | ICD-10-CM | POA: Diagnosis not present

## 2017-05-10 DIAGNOSIS — E785 Hyperlipidemia, unspecified: Secondary | ICD-10-CM | POA: Diagnosis not present

## 2017-06-03 DIAGNOSIS — K210 Gastro-esophageal reflux disease with esophagitis: Secondary | ICD-10-CM | POA: Diagnosis not present

## 2017-06-03 DIAGNOSIS — Z800 Family history of malignant neoplasm of digestive organs: Secondary | ICD-10-CM | POA: Diagnosis not present

## 2017-06-09 DIAGNOSIS — M542 Cervicalgia: Secondary | ICD-10-CM | POA: Diagnosis not present

## 2017-06-09 DIAGNOSIS — I10 Essential (primary) hypertension: Secondary | ICD-10-CM | POA: Diagnosis not present

## 2017-06-09 DIAGNOSIS — F341 Dysthymic disorder: Secondary | ICD-10-CM | POA: Diagnosis not present

## 2017-06-09 DIAGNOSIS — M79671 Pain in right foot: Secondary | ICD-10-CM | POA: Diagnosis not present

## 2017-06-09 DIAGNOSIS — R945 Abnormal results of liver function studies: Secondary | ICD-10-CM | POA: Diagnosis not present

## 2017-06-09 DIAGNOSIS — K219 Gastro-esophageal reflux disease without esophagitis: Secondary | ICD-10-CM | POA: Diagnosis not present

## 2017-06-09 DIAGNOSIS — R600 Localized edema: Secondary | ICD-10-CM | POA: Diagnosis not present

## 2017-06-09 DIAGNOSIS — Z6835 Body mass index (BMI) 35.0-35.9, adult: Secondary | ICD-10-CM | POA: Diagnosis not present

## 2017-06-09 DIAGNOSIS — E785 Hyperlipidemia, unspecified: Secondary | ICD-10-CM | POA: Diagnosis not present

## 2017-06-09 DIAGNOSIS — R0789 Other chest pain: Secondary | ICD-10-CM | POA: Diagnosis not present

## 2017-06-22 DIAGNOSIS — E669 Obesity, unspecified: Secondary | ICD-10-CM | POA: Diagnosis not present

## 2017-06-22 DIAGNOSIS — R945 Abnormal results of liver function studies: Secondary | ICD-10-CM | POA: Diagnosis not present

## 2017-06-22 DIAGNOSIS — F341 Dysthymic disorder: Secondary | ICD-10-CM | POA: Diagnosis not present

## 2017-06-22 DIAGNOSIS — M542 Cervicalgia: Secondary | ICD-10-CM | POA: Diagnosis not present

## 2017-06-22 DIAGNOSIS — Z6835 Body mass index (BMI) 35.0-35.9, adult: Secondary | ICD-10-CM | POA: Diagnosis not present

## 2017-06-22 DIAGNOSIS — E785 Hyperlipidemia, unspecified: Secondary | ICD-10-CM | POA: Diagnosis not present

## 2017-06-22 DIAGNOSIS — R0789 Other chest pain: Secondary | ICD-10-CM | POA: Diagnosis not present

## 2017-06-22 DIAGNOSIS — M79671 Pain in right foot: Secondary | ICD-10-CM | POA: Diagnosis not present

## 2017-06-22 DIAGNOSIS — R600 Localized edema: Secondary | ICD-10-CM | POA: Diagnosis not present

## 2017-06-22 DIAGNOSIS — K219 Gastro-esophageal reflux disease without esophagitis: Secondary | ICD-10-CM | POA: Diagnosis not present

## 2017-06-22 DIAGNOSIS — I10 Essential (primary) hypertension: Secondary | ICD-10-CM | POA: Diagnosis not present

## 2017-07-04 ENCOUNTER — Ambulatory Visit: Payer: Self-pay | Admitting: Podiatry

## 2017-07-06 ENCOUNTER — Encounter (HOSPITAL_COMMUNITY): Payer: Self-pay | Admitting: Psychiatry

## 2017-07-06 ENCOUNTER — Ambulatory Visit (HOSPITAL_COMMUNITY): Payer: PPO | Admitting: Psychiatry

## 2017-07-06 DIAGNOSIS — Z87891 Personal history of nicotine dependence: Secondary | ICD-10-CM

## 2017-07-06 DIAGNOSIS — M25562 Pain in left knee: Secondary | ICD-10-CM | POA: Diagnosis not present

## 2017-07-06 DIAGNOSIS — Z818 Family history of other mental and behavioral disorders: Secondary | ICD-10-CM

## 2017-07-06 DIAGNOSIS — F251 Schizoaffective disorder, depressive type: Secondary | ICD-10-CM

## 2017-07-06 MED ORDER — ARIPIPRAZOLE 10 MG PO TABS: 10.0000 mg | ORAL_TABLET | Freq: Every day | ORAL | 2 refills | Status: DC
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
## 2017-07-20 DIAGNOSIS — E785 Hyperlipidemia, unspecified: Secondary | ICD-10-CM | POA: Diagnosis not present

## 2017-07-20 DIAGNOSIS — F341 Dysthymic disorder: Secondary | ICD-10-CM | POA: Diagnosis not present

## 2017-07-20 DIAGNOSIS — I10 Essential (primary) hypertension: Secondary | ICD-10-CM | POA: Diagnosis not present

## 2017-07-20 DIAGNOSIS — R0789 Other chest pain: Secondary | ICD-10-CM | POA: Diagnosis not present

## 2017-07-20 DIAGNOSIS — R945 Abnormal results of liver function studies: Secondary | ICD-10-CM | POA: Diagnosis not present

## 2017-07-20 DIAGNOSIS — M542 Cervicalgia: Secondary | ICD-10-CM | POA: Diagnosis not present

## 2017-07-20 DIAGNOSIS — Z6836 Body mass index (BMI) 36.0-36.9, adult: Secondary | ICD-10-CM | POA: Diagnosis not present

## 2017-07-20 DIAGNOSIS — K219 Gastro-esophageal reflux disease without esophagitis: Secondary | ICD-10-CM | POA: Diagnosis not present

## 2017-07-20 DIAGNOSIS — M79671 Pain in right foot: Secondary | ICD-10-CM | POA: Diagnosis not present

## 2017-07-20 DIAGNOSIS — R600 Localized edema: Secondary | ICD-10-CM | POA: Diagnosis not present

## 2017-07-22 ENCOUNTER — Ambulatory Visit: Payer: PPO | Admitting: Podiatry

## 2017-07-22 ENCOUNTER — Ambulatory Visit (INDEPENDENT_AMBULATORY_CARE_PROVIDER_SITE_OTHER): Payer: PPO

## 2017-07-22 ENCOUNTER — Encounter: Payer: Self-pay | Admitting: Podiatry

## 2017-07-22 VITALS — BP 130/82 | HR 90

## 2017-07-22 DIAGNOSIS — M205X9 Other deformities of toe(s) (acquired), unspecified foot: Secondary | ICD-10-CM

## 2017-07-22 DIAGNOSIS — R52 Pain, unspecified: Secondary | ICD-10-CM | POA: Diagnosis not present

## 2017-07-22 DIAGNOSIS — M2020 Hallux rigidus, unspecified foot: Secondary | ICD-10-CM

## 2017-07-22 DIAGNOSIS — M722 Plantar fascial fibromatosis: Secondary | ICD-10-CM

## 2017-07-22 NOTE — Progress Notes (Signed)
   Subjective:    Patient ID: Randall Mccann, male    DOB: 1966/05/21, 52 y.o.   MRN: [id number]  HPI this patient presents the office with chief complaint of painful heels and arches on both feet.  He says the last 3 months. He has tried to increase his activity level but his feet have become painful.  He admits to having pain upon rising in the morning and standing from a sitting position in both heels.  He says his left foot is worse than the right foot.  He denies any history of trauma or injury to the foot.  He says he has purchased over-the-counter insoles and worn them in his shoe and they have minimally helped.  He also was given a prescription for relafen to take by mouth.  He presents the office today stating that his pain level is 8 out of 10 and he limps due to the foot pain.    Review of Systems  All other systems reviewed and are negative.      Objective:   Physical Exam General Appearance  Alert, conversant and in no acute stress.  Vascular  Dorsalis pedis and posterior pulses are palpable  bilaterally.  Capillary return is within normal limits  bilaterally. Temperature is within normal limits  Bilaterally.  Neurologic  Senn-Weinstein monofilament wire test within normal limits  bilaterally. Muscle power within normal limits bilaterally.  Nails Normal nails with no evidence of fungal or bacterial infection.  Orthopedic  No limitations of motion of motion feet bilaterally.  No crepitus or effusions noted. Dorsiflexion of approximately 35 in the first MPJ bilaterally.  Palpable pain noted at the insertion of the plantar fascia of the left greater than the right.  Skin  normotropic skin with no porokeratosis noted bilaterally.  No signs of infections or ulcers noted.          Assessment & Plan:  Plantar fasciitis  B/L  Hallux limitus.  IE  x-rays reveal a elongated first metatarsal, left foot.  Calcification at the insertion plantar fascia left foot.  X-rays of the right  foot reveal calcification at the insertion of the plantar fascia.  These x-rays also revealed an elongated first metatarsal with bony reactivity at the dorsal aspect of the first metatarsal head.  Explained his pain to this patient and we discussed ice and stretching exercises. Powerstep insoles were dispensed.  Patient was told to take Relafen as prescribed.  Injection therapy into the left heel was performed.Injection therapy using 1.0 cc. Of 2% xylocaine( 20 mg.) plus 1 cc. of kenalog-la ( 10 mg) plus 1/2 cc. of dexamethazone phosphate ( 2 mg).  RTC 2 weeks.  Patient is a candidate for kinetic wedge orthotic   Gardiner Barefoot DPM

## 2017-08-03 DIAGNOSIS — Z800 Family history of malignant neoplasm of digestive organs: Secondary | ICD-10-CM | POA: Diagnosis not present

## 2017-08-03 DIAGNOSIS — K210 Gastro-esophageal reflux disease with esophagitis: Secondary | ICD-10-CM | POA: Diagnosis not present

## 2017-08-04 ENCOUNTER — Other Ambulatory Visit: Payer: Self-pay | Admitting: Podiatry

## 2017-08-04 DIAGNOSIS — M722 Plantar fascial fibromatosis: Secondary | ICD-10-CM

## 2017-08-10 ENCOUNTER — Encounter: Payer: Self-pay | Admitting: Podiatry

## 2017-08-10 ENCOUNTER — Ambulatory Visit: Payer: PPO | Admitting: Podiatry

## 2017-08-10 DIAGNOSIS — M722 Plantar fascial fibromatosis: Secondary | ICD-10-CM | POA: Diagnosis not present

## 2017-08-10 DIAGNOSIS — M205X9 Other deformities of toe(s) (acquired), unspecified foot: Secondary | ICD-10-CM | POA: Diagnosis not present

## 2017-08-10 DIAGNOSIS — M2020 Hallux rigidus, unspecified foot: Secondary | ICD-10-CM

## 2017-08-10 NOTE — Progress Notes (Signed)
This patient presents to the office stating that his feet are much improved compared to his visit 3 weeks ago.  He was seen in the office and diagnosed with plantar fasciitis and hallux limitus bilaterally.  He was dispensed power step insoles through this office.  He was told to take the Relafen which she had at home.  He also was treated with injection therapy in the left heel. He says that he has  minimal pain in his left heel today.  He says he was able to walk over a mile this morning not having any pain or discomfort.  He is very pleased with his progress and presents the office today for continued evaluation and treatment.    General Appearance  Alert, conversant and in no acute stress.  Vascular  Dorsalis pedis and posterior tibial  pulses are palpable  bilaterally.  Capillary return is within normal limits  bilaterally. Temperature is within normal limits  bilaterally.  Neurologic  Senn-Weinstein monofilament wire test within normal limits  bilaterally. Muscle power within normal limits bilaterally.  Nails Thick disfigured discolored nails with subungual debris  from hallux to fifth toes bilaterally. No evidence of bacterial infection or drainage bilaterally.  Orthopedic  No limitations of motion of motion feet .  No crepitus or effusions noted.  No bony pathology or digital deformities noted. Functional hallux limitus 1st MPJ  B/L.  No pain elicited left heel.  Skin  normotropic skin with no porokeratosis noted bilaterally.  No signs of infections or ulcers noted.     Plantar fascitis left heel.  ROV.  Patient was told to continue to wear his power step insoles.  We still will consider kinetic wedge orthotics for this patient in the future.  Patient to return to the clinic when necessary   Gardiner Barefoot DPM

## 2017-08-17 DIAGNOSIS — R945 Abnormal results of liver function studies: Secondary | ICD-10-CM | POA: Diagnosis not present

## 2017-08-17 DIAGNOSIS — Z6836 Body mass index (BMI) 36.0-36.9, adult: Secondary | ICD-10-CM | POA: Diagnosis not present

## 2017-08-17 DIAGNOSIS — R5382 Chronic fatigue, unspecified: Secondary | ICD-10-CM | POA: Diagnosis not present

## 2017-08-17 DIAGNOSIS — R0789 Other chest pain: Secondary | ICD-10-CM | POA: Diagnosis not present

## 2017-08-17 DIAGNOSIS — R600 Localized edema: Secondary | ICD-10-CM | POA: Diagnosis not present

## 2017-08-17 DIAGNOSIS — M545 Low back pain: Secondary | ICD-10-CM | POA: Diagnosis not present

## 2017-08-17 DIAGNOSIS — E785 Hyperlipidemia, unspecified: Secondary | ICD-10-CM | POA: Diagnosis not present

## 2017-08-17 DIAGNOSIS — I10 Essential (primary) hypertension: Secondary | ICD-10-CM | POA: Diagnosis not present

## 2017-08-17 DIAGNOSIS — K219 Gastro-esophageal reflux disease without esophagitis: Secondary | ICD-10-CM | POA: Diagnosis not present

## 2017-08-17 DIAGNOSIS — M542 Cervicalgia: Secondary | ICD-10-CM | POA: Diagnosis not present

## 2017-08-17 DIAGNOSIS — F341 Dysthymic disorder: Secondary | ICD-10-CM | POA: Diagnosis not present

## 2017-08-17 DIAGNOSIS — M79671 Pain in right foot: Secondary | ICD-10-CM | POA: Diagnosis not present

## 2017-08-24 DIAGNOSIS — E785 Hyperlipidemia, unspecified: Secondary | ICD-10-CM | POA: Diagnosis not present

## 2017-08-24 DIAGNOSIS — R945 Abnormal results of liver function studies: Secondary | ICD-10-CM | POA: Diagnosis not present

## 2017-08-24 DIAGNOSIS — K219 Gastro-esophageal reflux disease without esophagitis: Secondary | ICD-10-CM | POA: Diagnosis not present

## 2017-08-24 DIAGNOSIS — R600 Localized edema: Secondary | ICD-10-CM | POA: Diagnosis not present

## 2017-08-24 DIAGNOSIS — M545 Low back pain: Secondary | ICD-10-CM | POA: Diagnosis not present

## 2017-08-24 DIAGNOSIS — F341 Dysthymic disorder: Secondary | ICD-10-CM | POA: Diagnosis not present

## 2017-08-24 DIAGNOSIS — R0789 Other chest pain: Secondary | ICD-10-CM | POA: Diagnosis not present

## 2017-08-24 DIAGNOSIS — R739 Hyperglycemia, unspecified: Secondary | ICD-10-CM | POA: Diagnosis not present

## 2017-08-24 DIAGNOSIS — M542 Cervicalgia: Secondary | ICD-10-CM | POA: Diagnosis not present

## 2017-08-24 DIAGNOSIS — I10 Essential (primary) hypertension: Secondary | ICD-10-CM | POA: Diagnosis not present

## 2017-08-24 DIAGNOSIS — M79671 Pain in right foot: Secondary | ICD-10-CM | POA: Diagnosis not present

## 2017-08-24 DIAGNOSIS — M25552 Pain in left hip: Secondary | ICD-10-CM | POA: Diagnosis not present

## 2017-08-31 DIAGNOSIS — M542 Cervicalgia: Secondary | ICD-10-CM | POA: Diagnosis not present

## 2017-08-31 DIAGNOSIS — R600 Localized edema: Secondary | ICD-10-CM | POA: Diagnosis not present

## 2017-08-31 DIAGNOSIS — F329 Major depressive disorder, single episode, unspecified: Secondary | ICD-10-CM | POA: Diagnosis not present

## 2017-08-31 DIAGNOSIS — R0789 Other chest pain: Secondary | ICD-10-CM | POA: Diagnosis not present

## 2017-08-31 DIAGNOSIS — K219 Gastro-esophageal reflux disease without esophagitis: Secondary | ICD-10-CM | POA: Diagnosis not present

## 2017-08-31 DIAGNOSIS — E785 Hyperlipidemia, unspecified: Secondary | ICD-10-CM | POA: Diagnosis not present

## 2017-08-31 DIAGNOSIS — M79671 Pain in right foot: Secondary | ICD-10-CM | POA: Diagnosis not present

## 2017-08-31 DIAGNOSIS — I10 Essential (primary) hypertension: Secondary | ICD-10-CM | POA: Diagnosis not present

## 2017-08-31 DIAGNOSIS — E118 Type 2 diabetes mellitus with unspecified complications: Secondary | ICD-10-CM | POA: Diagnosis not present

## 2017-08-31 DIAGNOSIS — Z6835 Body mass index (BMI) 35.0-35.9, adult: Secondary | ICD-10-CM | POA: Diagnosis not present

## 2017-08-31 DIAGNOSIS — R945 Abnormal results of liver function studies: Secondary | ICD-10-CM | POA: Diagnosis not present

## 2017-09-29 DIAGNOSIS — I10 Essential (primary) hypertension: Secondary | ICD-10-CM | POA: Diagnosis not present

## 2017-09-29 DIAGNOSIS — L918 Other hypertrophic disorders of the skin: Secondary | ICD-10-CM | POA: Diagnosis not present

## 2017-09-29 DIAGNOSIS — M542 Cervicalgia: Secondary | ICD-10-CM | POA: Diagnosis not present

## 2017-09-29 DIAGNOSIS — Z6833 Body mass index (BMI) 33.0-33.9, adult: Secondary | ICD-10-CM | POA: Diagnosis not present

## 2017-09-29 DIAGNOSIS — E118 Type 2 diabetes mellitus with unspecified complications: Secondary | ICD-10-CM | POA: Diagnosis not present

## 2017-09-29 DIAGNOSIS — F329 Major depressive disorder, single episode, unspecified: Secondary | ICD-10-CM | POA: Diagnosis not present

## 2017-09-29 DIAGNOSIS — L719 Rosacea, unspecified: Secondary | ICD-10-CM | POA: Diagnosis not present

## 2017-09-29 DIAGNOSIS — R945 Abnormal results of liver function studies: Secondary | ICD-10-CM | POA: Diagnosis not present

## 2017-09-29 DIAGNOSIS — E785 Hyperlipidemia, unspecified: Secondary | ICD-10-CM | POA: Diagnosis not present

## 2017-09-29 DIAGNOSIS — M79671 Pain in right foot: Secondary | ICD-10-CM | POA: Diagnosis not present

## 2017-09-29 DIAGNOSIS — K219 Gastro-esophageal reflux disease without esophagitis: Secondary | ICD-10-CM | POA: Diagnosis not present

## 2017-09-29 DIAGNOSIS — E669 Obesity, unspecified: Secondary | ICD-10-CM | POA: Diagnosis not present

## 2017-09-29 DIAGNOSIS — R0789 Other chest pain: Secondary | ICD-10-CM | POA: Diagnosis not present

## 2017-09-29 DIAGNOSIS — R600 Localized edema: Secondary | ICD-10-CM | POA: Diagnosis not present

## 2017-10-04 ENCOUNTER — Encounter (HOSPITAL_COMMUNITY): Payer: Self-pay | Admitting: Psychiatry

## 2017-10-04 ENCOUNTER — Ambulatory Visit (HOSPITAL_COMMUNITY): Payer: PPO | Admitting: Psychiatry

## 2017-10-04 DIAGNOSIS — F251 Schizoaffective disorder, depressive type: Secondary | ICD-10-CM | POA: Diagnosis not present

## 2017-10-04 DIAGNOSIS — Z818 Family history of other mental and behavioral disorders: Secondary | ICD-10-CM | POA: Diagnosis not present

## 2017-10-04 MED ORDER — ARIPIPRAZOLE 10 MG PO TABS: 10.0000 mg | ORAL_TABLET | Freq: Every day | ORAL | 2 refills | Status: DC
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
## 2017-10-06 DIAGNOSIS — E119 Type 2 diabetes mellitus without complications: Secondary | ICD-10-CM | POA: Diagnosis not present

## 2017-10-18 DIAGNOSIS — E669 Obesity, unspecified: Secondary | ICD-10-CM | POA: Diagnosis not present

## 2017-10-18 DIAGNOSIS — E785 Hyperlipidemia, unspecified: Secondary | ICD-10-CM | POA: Diagnosis not present

## 2017-10-18 DIAGNOSIS — E118 Type 2 diabetes mellitus with unspecified complications: Secondary | ICD-10-CM | POA: Diagnosis not present

## 2017-10-18 DIAGNOSIS — Z1331 Encounter for screening for depression: Secondary | ICD-10-CM | POA: Diagnosis not present

## 2017-10-18 DIAGNOSIS — Z0000 Encounter for general adult medical examination without abnormal findings: Secondary | ICD-10-CM | POA: Diagnosis not present

## 2017-11-03 DIAGNOSIS — E118 Type 2 diabetes mellitus with unspecified complications: Secondary | ICD-10-CM | POA: Diagnosis not present

## 2017-11-07 ENCOUNTER — Telehealth (HOSPITAL_COMMUNITY): Payer: Self-pay

## 2017-11-07 DIAGNOSIS — R0789 Other chest pain: Secondary | ICD-10-CM | POA: Diagnosis not present

## 2017-11-07 DIAGNOSIS — E785 Hyperlipidemia, unspecified: Secondary | ICD-10-CM | POA: Diagnosis not present

## 2017-11-07 DIAGNOSIS — R600 Localized edema: Secondary | ICD-10-CM | POA: Diagnosis not present

## 2017-11-07 DIAGNOSIS — M542 Cervicalgia: Secondary | ICD-10-CM | POA: Diagnosis not present

## 2017-11-07 DIAGNOSIS — R945 Abnormal results of liver function studies: Secondary | ICD-10-CM | POA: Diagnosis not present

## 2017-11-07 DIAGNOSIS — F329 Major depressive disorder, single episode, unspecified: Secondary | ICD-10-CM | POA: Diagnosis not present

## 2017-11-07 DIAGNOSIS — E669 Obesity, unspecified: Secondary | ICD-10-CM | POA: Diagnosis not present

## 2017-11-07 DIAGNOSIS — I10 Essential (primary) hypertension: Secondary | ICD-10-CM | POA: Diagnosis not present

## 2017-11-07 DIAGNOSIS — M79671 Pain in right foot: Secondary | ICD-10-CM | POA: Diagnosis not present

## 2017-11-07 DIAGNOSIS — K219 Gastro-esophageal reflux disease without esophagitis: Secondary | ICD-10-CM | POA: Diagnosis not present

## 2017-11-07 DIAGNOSIS — Z6833 Body mass index (BMI) 33.0-33.9, adult: Secondary | ICD-10-CM | POA: Diagnosis not present

## 2017-11-07 NOTE — Telephone Encounter (Signed)
Patient was at his PCP and he was going to start patient on Ambien, but wanted to clear it with you first since patient is on Abilify. Please review and advise, thank you

## 2017-11-09 ENCOUNTER — Other Ambulatory Visit (HOSPITAL_COMMUNITY): Payer: Self-pay | Admitting: Psychiatry

## 2017-11-09 NOTE — Telephone Encounter (Signed)
He can try Ambien however Ambien sometimes cause sleepwalking and insomnia.  If these symptoms occur then he need to stop the Ambien.

## 2017-11-21 DIAGNOSIS — R945 Abnormal results of liver function studies: Secondary | ICD-10-CM | POA: Diagnosis not present

## 2017-11-21 DIAGNOSIS — F329 Major depressive disorder, single episode, unspecified: Secondary | ICD-10-CM | POA: Diagnosis not present

## 2017-11-21 DIAGNOSIS — E785 Hyperlipidemia, unspecified: Secondary | ICD-10-CM | POA: Diagnosis not present

## 2017-11-21 DIAGNOSIS — Z6833 Body mass index (BMI) 33.0-33.9, adult: Secondary | ICD-10-CM | POA: Diagnosis not present

## 2017-11-21 DIAGNOSIS — K219 Gastro-esophageal reflux disease without esophagitis: Secondary | ICD-10-CM | POA: Diagnosis not present

## 2017-11-21 DIAGNOSIS — R0789 Other chest pain: Secondary | ICD-10-CM | POA: Diagnosis not present

## 2017-11-21 DIAGNOSIS — M542 Cervicalgia: Secondary | ICD-10-CM | POA: Diagnosis not present

## 2017-11-21 DIAGNOSIS — R600 Localized edema: Secondary | ICD-10-CM | POA: Diagnosis not present

## 2017-11-21 DIAGNOSIS — I10 Essential (primary) hypertension: Secondary | ICD-10-CM | POA: Diagnosis not present

## 2017-11-21 DIAGNOSIS — J028 Acute pharyngitis due to other specified organisms: Secondary | ICD-10-CM | POA: Diagnosis not present

## 2017-11-21 DIAGNOSIS — E669 Obesity, unspecified: Secondary | ICD-10-CM | POA: Diagnosis not present

## 2017-11-21 DIAGNOSIS — M79671 Pain in right foot: Secondary | ICD-10-CM | POA: Diagnosis not present

## 2017-11-28 DIAGNOSIS — R600 Localized edema: Secondary | ICD-10-CM | POA: Diagnosis not present

## 2017-11-28 DIAGNOSIS — F329 Major depressive disorder, single episode, unspecified: Secondary | ICD-10-CM | POA: Diagnosis not present

## 2017-11-28 DIAGNOSIS — E118 Type 2 diabetes mellitus with unspecified complications: Secondary | ICD-10-CM | POA: Diagnosis not present

## 2017-11-28 DIAGNOSIS — M542 Cervicalgia: Secondary | ICD-10-CM | POA: Diagnosis not present

## 2017-11-28 DIAGNOSIS — R945 Abnormal results of liver function studies: Secondary | ICD-10-CM | POA: Diagnosis not present

## 2017-11-28 DIAGNOSIS — M79671 Pain in right foot: Secondary | ICD-10-CM | POA: Diagnosis not present

## 2017-11-28 DIAGNOSIS — K219 Gastro-esophageal reflux disease without esophagitis: Secondary | ICD-10-CM | POA: Diagnosis not present

## 2017-11-28 DIAGNOSIS — J028 Acute pharyngitis due to other specified organisms: Secondary | ICD-10-CM | POA: Diagnosis not present

## 2017-11-28 DIAGNOSIS — E785 Hyperlipidemia, unspecified: Secondary | ICD-10-CM | POA: Diagnosis not present

## 2017-11-28 DIAGNOSIS — M545 Low back pain: Secondary | ICD-10-CM | POA: Diagnosis not present

## 2017-11-28 DIAGNOSIS — I10 Essential (primary) hypertension: Secondary | ICD-10-CM | POA: Diagnosis not present

## 2017-11-28 DIAGNOSIS — R0789 Other chest pain: Secondary | ICD-10-CM | POA: Diagnosis not present

## 2017-12-07 DIAGNOSIS — M79671 Pain in right foot: Secondary | ICD-10-CM | POA: Diagnosis not present

## 2017-12-07 DIAGNOSIS — I10 Essential (primary) hypertension: Secondary | ICD-10-CM | POA: Diagnosis not present

## 2017-12-07 DIAGNOSIS — E669 Obesity, unspecified: Secondary | ICD-10-CM | POA: Diagnosis not present

## 2017-12-07 DIAGNOSIS — M542 Cervicalgia: Secondary | ICD-10-CM | POA: Diagnosis not present

## 2017-12-07 DIAGNOSIS — F329 Major depressive disorder, single episode, unspecified: Secondary | ICD-10-CM | POA: Diagnosis not present

## 2017-12-07 DIAGNOSIS — R945 Abnormal results of liver function studies: Secondary | ICD-10-CM | POA: Diagnosis not present

## 2017-12-07 DIAGNOSIS — M545 Low back pain: Secondary | ICD-10-CM | POA: Diagnosis not present

## 2017-12-07 DIAGNOSIS — E118 Type 2 diabetes mellitus with unspecified complications: Secondary | ICD-10-CM | POA: Diagnosis not present

## 2017-12-07 DIAGNOSIS — K219 Gastro-esophageal reflux disease without esophagitis: Secondary | ICD-10-CM | POA: Diagnosis not present

## 2017-12-07 DIAGNOSIS — R600 Localized edema: Secondary | ICD-10-CM | POA: Diagnosis not present

## 2017-12-07 DIAGNOSIS — R0789 Other chest pain: Secondary | ICD-10-CM | POA: Diagnosis not present

## 2017-12-07 DIAGNOSIS — E785 Hyperlipidemia, unspecified: Secondary | ICD-10-CM | POA: Diagnosis not present

## 2017-12-08 ENCOUNTER — Telehealth: Payer: Self-pay | Admitting: Podiatry

## 2017-12-08 NOTE — Telephone Encounter (Signed)
I'm a pt of Dr. Burnell Blanks and I would like to arrange to have my medical records shared with [name] my general practitioner here in [place]. The best number to reach me at is [phone]. Thank you.

## 2017-12-08 NOTE — Telephone Encounter (Signed)
Called pt back to let him know he would need to fill out and sign a medical records release form authorizing me to release his records to his general practitioner [name]. Pt stated he would go by the [place] office to pick the form up and then mail to me here in the [place] office.

## 2018-01-04 ENCOUNTER — Ambulatory Visit (HOSPITAL_COMMUNITY): Payer: Self-pay | Admitting: Psychiatry

## 2018-01-05 DIAGNOSIS — M79671 Pain in right foot: Secondary | ICD-10-CM | POA: Diagnosis not present

## 2018-01-05 DIAGNOSIS — F329 Major depressive disorder, single episode, unspecified: Secondary | ICD-10-CM | POA: Diagnosis not present

## 2018-01-05 DIAGNOSIS — I10 Essential (primary) hypertension: Secondary | ICD-10-CM | POA: Diagnosis not present

## 2018-01-05 DIAGNOSIS — R0789 Other chest pain: Secondary | ICD-10-CM | POA: Diagnosis not present

## 2018-01-05 DIAGNOSIS — R945 Abnormal results of liver function studies: Secondary | ICD-10-CM | POA: Diagnosis not present

## 2018-01-05 DIAGNOSIS — M542 Cervicalgia: Secondary | ICD-10-CM | POA: Diagnosis not present

## 2018-01-05 DIAGNOSIS — M545 Low back pain: Secondary | ICD-10-CM | POA: Diagnosis not present

## 2018-01-05 DIAGNOSIS — R600 Localized edema: Secondary | ICD-10-CM | POA: Diagnosis not present

## 2018-01-05 DIAGNOSIS — E669 Obesity, unspecified: Secondary | ICD-10-CM | POA: Diagnosis not present

## 2018-01-05 DIAGNOSIS — E785 Hyperlipidemia, unspecified: Secondary | ICD-10-CM | POA: Diagnosis not present

## 2018-01-05 DIAGNOSIS — E118 Type 2 diabetes mellitus with unspecified complications: Secondary | ICD-10-CM | POA: Diagnosis not present

## 2018-01-05 DIAGNOSIS — K219 Gastro-esophageal reflux disease without esophagitis: Secondary | ICD-10-CM | POA: Diagnosis not present

## 2018-01-12 DIAGNOSIS — L821 Other seborrheic keratosis: Secondary | ICD-10-CM | POA: Diagnosis not present

## 2018-01-12 DIAGNOSIS — D2239 Melanocytic nevi of other parts of face: Secondary | ICD-10-CM | POA: Diagnosis not present

## 2018-01-12 DIAGNOSIS — L719 Rosacea, unspecified: Secondary | ICD-10-CM | POA: Diagnosis not present

## 2018-01-12 DIAGNOSIS — D485 Neoplasm of uncertain behavior of skin: Secondary | ICD-10-CM | POA: Diagnosis not present

## 2018-01-12 DIAGNOSIS — D225 Melanocytic nevi of trunk: Secondary | ICD-10-CM | POA: Diagnosis not present

## 2018-01-19 DIAGNOSIS — R945 Abnormal results of liver function studies: Secondary | ICD-10-CM | POA: Diagnosis not present

## 2018-01-19 DIAGNOSIS — E785 Hyperlipidemia, unspecified: Secondary | ICD-10-CM | POA: Diagnosis not present

## 2018-01-19 DIAGNOSIS — R600 Localized edema: Secondary | ICD-10-CM | POA: Diagnosis not present

## 2018-01-19 DIAGNOSIS — M545 Low back pain: Secondary | ICD-10-CM | POA: Diagnosis not present

## 2018-01-19 DIAGNOSIS — I10 Essential (primary) hypertension: Secondary | ICD-10-CM | POA: Diagnosis not present

## 2018-01-19 DIAGNOSIS — E118 Type 2 diabetes mellitus with unspecified complications: Secondary | ICD-10-CM | POA: Diagnosis not present

## 2018-01-19 DIAGNOSIS — G4733 Obstructive sleep apnea (adult) (pediatric): Secondary | ICD-10-CM | POA: Diagnosis not present

## 2018-01-19 DIAGNOSIS — R0789 Other chest pain: Secondary | ICD-10-CM | POA: Diagnosis not present

## 2018-01-19 DIAGNOSIS — M79671 Pain in right foot: Secondary | ICD-10-CM | POA: Diagnosis not present

## 2018-01-19 DIAGNOSIS — K219 Gastro-esophageal reflux disease without esophagitis: Secondary | ICD-10-CM | POA: Diagnosis not present

## 2018-01-19 DIAGNOSIS — F329 Major depressive disorder, single episode, unspecified: Secondary | ICD-10-CM | POA: Diagnosis not present

## 2018-01-19 DIAGNOSIS — M542 Cervicalgia: Secondary | ICD-10-CM | POA: Diagnosis not present

## 2018-01-20 DIAGNOSIS — G4733 Obstructive sleep apnea (adult) (pediatric): Secondary | ICD-10-CM | POA: Diagnosis not present

## 2018-01-20 DIAGNOSIS — I10 Essential (primary) hypertension: Secondary | ICD-10-CM | POA: Diagnosis not present

## 2018-01-20 DIAGNOSIS — R0789 Other chest pain: Secondary | ICD-10-CM | POA: Diagnosis not present

## 2018-01-20 DIAGNOSIS — R945 Abnormal results of liver function studies: Secondary | ICD-10-CM | POA: Diagnosis not present

## 2018-01-20 DIAGNOSIS — E785 Hyperlipidemia, unspecified: Secondary | ICD-10-CM | POA: Diagnosis not present

## 2018-01-20 DIAGNOSIS — M79671 Pain in right foot: Secondary | ICD-10-CM | POA: Diagnosis not present

## 2018-01-20 DIAGNOSIS — M542 Cervicalgia: Secondary | ICD-10-CM | POA: Diagnosis not present

## 2018-01-20 DIAGNOSIS — F329 Major depressive disorder, single episode, unspecified: Secondary | ICD-10-CM | POA: Diagnosis not present

## 2018-01-20 DIAGNOSIS — E118 Type 2 diabetes mellitus with unspecified complications: Secondary | ICD-10-CM | POA: Diagnosis not present

## 2018-01-20 DIAGNOSIS — K219 Gastro-esophageal reflux disease without esophagitis: Secondary | ICD-10-CM | POA: Diagnosis not present

## 2018-01-20 DIAGNOSIS — M545 Low back pain: Secondary | ICD-10-CM | POA: Diagnosis not present

## 2018-01-20 DIAGNOSIS — R600 Localized edema: Secondary | ICD-10-CM | POA: Diagnosis not present

## 2018-02-10 DIAGNOSIS — R945 Abnormal results of liver function studies: Secondary | ICD-10-CM | POA: Diagnosis not present

## 2018-02-10 DIAGNOSIS — I10 Essential (primary) hypertension: Secondary | ICD-10-CM | POA: Diagnosis not present

## 2018-02-10 DIAGNOSIS — R0789 Other chest pain: Secondary | ICD-10-CM | POA: Diagnosis not present

## 2018-02-10 DIAGNOSIS — K219 Gastro-esophageal reflux disease without esophagitis: Secondary | ICD-10-CM | POA: Diagnosis not present

## 2018-02-10 DIAGNOSIS — M25511 Pain in right shoulder: Secondary | ICD-10-CM | POA: Diagnosis not present

## 2018-02-10 DIAGNOSIS — E785 Hyperlipidemia, unspecified: Secondary | ICD-10-CM | POA: Diagnosis not present

## 2018-02-10 DIAGNOSIS — M542 Cervicalgia: Secondary | ICD-10-CM | POA: Diagnosis not present

## 2018-02-10 DIAGNOSIS — M545 Low back pain: Secondary | ICD-10-CM | POA: Diagnosis not present

## 2018-02-10 DIAGNOSIS — M79671 Pain in right foot: Secondary | ICD-10-CM | POA: Diagnosis not present

## 2018-02-10 DIAGNOSIS — E118 Type 2 diabetes mellitus with unspecified complications: Secondary | ICD-10-CM | POA: Diagnosis not present

## 2018-02-10 DIAGNOSIS — F329 Major depressive disorder, single episode, unspecified: Secondary | ICD-10-CM | POA: Diagnosis not present

## 2018-02-10 DIAGNOSIS — R600 Localized edema: Secondary | ICD-10-CM | POA: Diagnosis not present

## 2018-02-17 DIAGNOSIS — R600 Localized edema: Secondary | ICD-10-CM | POA: Diagnosis not present

## 2018-02-17 DIAGNOSIS — F329 Major depressive disorder, single episode, unspecified: Secondary | ICD-10-CM | POA: Diagnosis not present

## 2018-02-17 DIAGNOSIS — R0789 Other chest pain: Secondary | ICD-10-CM | POA: Diagnosis not present

## 2018-02-17 DIAGNOSIS — I10 Essential (primary) hypertension: Secondary | ICD-10-CM | POA: Diagnosis not present

## 2018-02-17 DIAGNOSIS — E118 Type 2 diabetes mellitus with unspecified complications: Secondary | ICD-10-CM | POA: Diagnosis not present

## 2018-02-17 DIAGNOSIS — R945 Abnormal results of liver function studies: Secondary | ICD-10-CM | POA: Diagnosis not present

## 2018-02-17 DIAGNOSIS — M542 Cervicalgia: Secondary | ICD-10-CM | POA: Diagnosis not present

## 2018-02-17 DIAGNOSIS — G4733 Obstructive sleep apnea (adult) (pediatric): Secondary | ICD-10-CM | POA: Diagnosis not present

## 2018-02-17 DIAGNOSIS — M79671 Pain in right foot: Secondary | ICD-10-CM | POA: Diagnosis not present

## 2018-02-17 DIAGNOSIS — K219 Gastro-esophageal reflux disease without esophagitis: Secondary | ICD-10-CM | POA: Diagnosis not present

## 2018-02-17 DIAGNOSIS — E785 Hyperlipidemia, unspecified: Secondary | ICD-10-CM | POA: Diagnosis not present

## 2018-02-17 DIAGNOSIS — M545 Low back pain: Secondary | ICD-10-CM | POA: Diagnosis not present

## 2018-03-07 ENCOUNTER — Ambulatory Visit (INDEPENDENT_AMBULATORY_CARE_PROVIDER_SITE_OTHER): Payer: PPO | Admitting: Psychiatry

## 2018-03-07 ENCOUNTER — Encounter (HOSPITAL_COMMUNITY): Payer: Self-pay | Admitting: Psychiatry

## 2018-03-07 DIAGNOSIS — F251 Schizoaffective disorder, depressive type: Secondary | ICD-10-CM | POA: Diagnosis not present

## 2018-03-07 MED ORDER — ARIPIPRAZOLE 10 MG PO TABS: 10.0000 mg | ORAL_TABLET | Freq: Every day | ORAL | 0 refills | Status: DC
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
## 2018-03-09 DIAGNOSIS — Z800 Family history of malignant neoplasm of digestive organs: Secondary | ICD-10-CM | POA: Diagnosis not present

## 2018-03-09 DIAGNOSIS — K210 Gastro-esophageal reflux disease with esophagitis: Secondary | ICD-10-CM | POA: Diagnosis not present

## 2018-03-15 DIAGNOSIS — G4733 Obstructive sleep apnea (adult) (pediatric): Secondary | ICD-10-CM | POA: Diagnosis not present

## 2018-03-17 DIAGNOSIS — Z23 Encounter for immunization: Secondary | ICD-10-CM | POA: Diagnosis not present

## 2018-03-17 DIAGNOSIS — Z1339 Encounter for screening examination for other mental health and behavioral disorders: Secondary | ICD-10-CM | POA: Diagnosis not present

## 2018-03-17 DIAGNOSIS — I10 Essential (primary) hypertension: Secondary | ICD-10-CM | POA: Diagnosis not present

## 2018-03-17 DIAGNOSIS — F3341 Major depressive disorder, recurrent, in partial remission: Secondary | ICD-10-CM | POA: Diagnosis not present

## 2018-03-17 DIAGNOSIS — E785 Hyperlipidemia, unspecified: Secondary | ICD-10-CM | POA: Diagnosis not present

## 2018-03-17 DIAGNOSIS — R600 Localized edema: Secondary | ICD-10-CM | POA: Diagnosis not present

## 2018-03-17 DIAGNOSIS — K219 Gastro-esophageal reflux disease without esophagitis: Secondary | ICD-10-CM | POA: Diagnosis not present

## 2018-03-17 DIAGNOSIS — M79671 Pain in right foot: Secondary | ICD-10-CM | POA: Diagnosis not present

## 2018-03-17 DIAGNOSIS — Z1331 Encounter for screening for depression: Secondary | ICD-10-CM | POA: Diagnosis not present

## 2018-03-17 DIAGNOSIS — R0789 Other chest pain: Secondary | ICD-10-CM | POA: Diagnosis not present

## 2018-03-17 DIAGNOSIS — G4733 Obstructive sleep apnea (adult) (pediatric): Secondary | ICD-10-CM | POA: Diagnosis not present

## 2018-03-17 DIAGNOSIS — M542 Cervicalgia: Secondary | ICD-10-CM | POA: Diagnosis not present

## 2018-03-17 DIAGNOSIS — R945 Abnormal results of liver function studies: Secondary | ICD-10-CM | POA: Diagnosis not present

## 2018-03-17 DIAGNOSIS — M545 Low back pain: Secondary | ICD-10-CM | POA: Diagnosis not present

## 2018-03-30 DIAGNOSIS — Z6835 Body mass index (BMI) 35.0-35.9, adult: Secondary | ICD-10-CM | POA: Diagnosis not present

## 2018-03-30 DIAGNOSIS — G4733 Obstructive sleep apnea (adult) (pediatric): Secondary | ICD-10-CM | POA: Diagnosis not present

## 2018-03-30 DIAGNOSIS — J342 Deviated nasal septum: Secondary | ICD-10-CM | POA: Diagnosis not present

## 2018-04-09 DIAGNOSIS — G4733 Obstructive sleep apnea (adult) (pediatric): Secondary | ICD-10-CM | POA: Diagnosis not present

## 2018-04-19 DIAGNOSIS — Z6835 Body mass index (BMI) 35.0-35.9, adult: Secondary | ICD-10-CM | POA: Diagnosis not present

## 2018-04-19 DIAGNOSIS — J342 Deviated nasal septum: Secondary | ICD-10-CM | POA: Diagnosis not present

## 2018-04-19 DIAGNOSIS — G4733 Obstructive sleep apnea (adult) (pediatric): Secondary | ICD-10-CM | POA: Diagnosis not present

## 2018-05-05 DIAGNOSIS — F3341 Major depressive disorder, recurrent, in partial remission: Secondary | ICD-10-CM | POA: Diagnosis not present

## 2018-05-05 DIAGNOSIS — M545 Low back pain: Secondary | ICD-10-CM | POA: Diagnosis not present

## 2018-05-05 DIAGNOSIS — G4733 Obstructive sleep apnea (adult) (pediatric): Secondary | ICD-10-CM | POA: Diagnosis not present

## 2018-05-05 DIAGNOSIS — I10 Essential (primary) hypertension: Secondary | ICD-10-CM | POA: Diagnosis not present

## 2018-05-05 DIAGNOSIS — R600 Localized edema: Secondary | ICD-10-CM | POA: Diagnosis not present

## 2018-05-05 DIAGNOSIS — E118 Type 2 diabetes mellitus with unspecified complications: Secondary | ICD-10-CM | POA: Diagnosis not present

## 2018-05-05 DIAGNOSIS — R0789 Other chest pain: Secondary | ICD-10-CM | POA: Diagnosis not present

## 2018-05-05 DIAGNOSIS — K219 Gastro-esophageal reflux disease without esophagitis: Secondary | ICD-10-CM | POA: Diagnosis not present

## 2018-05-05 DIAGNOSIS — M542 Cervicalgia: Secondary | ICD-10-CM | POA: Diagnosis not present

## 2018-05-05 DIAGNOSIS — M79671 Pain in right foot: Secondary | ICD-10-CM | POA: Diagnosis not present

## 2018-05-05 DIAGNOSIS — R945 Abnormal results of liver function studies: Secondary | ICD-10-CM | POA: Diagnosis not present

## 2018-05-10 DIAGNOSIS — G4733 Obstructive sleep apnea (adult) (pediatric): Secondary | ICD-10-CM | POA: Diagnosis not present

## 2018-05-18 DIAGNOSIS — M542 Cervicalgia: Secondary | ICD-10-CM | POA: Diagnosis not present

## 2018-05-18 DIAGNOSIS — M4802 Spinal stenosis, cervical region: Secondary | ICD-10-CM | POA: Diagnosis not present

## 2018-05-18 DIAGNOSIS — E669 Obesity, unspecified: Secondary | ICD-10-CM | POA: Diagnosis not present

## 2018-05-18 DIAGNOSIS — Z6835 Body mass index (BMI) 35.0-35.9, adult: Secondary | ICD-10-CM | POA: Diagnosis not present

## 2018-05-29 DIAGNOSIS — G8929 Other chronic pain: Secondary | ICD-10-CM | POA: Diagnosis not present

## 2018-05-29 DIAGNOSIS — M25511 Pain in right shoulder: Secondary | ICD-10-CM | POA: Diagnosis not present

## 2018-05-29 DIAGNOSIS — Z6836 Body mass index (BMI) 36.0-36.9, adult: Secondary | ICD-10-CM | POA: Diagnosis not present

## 2018-06-08 ENCOUNTER — Ambulatory Visit (HOSPITAL_COMMUNITY): Payer: PPO | Admitting: Psychiatry

## 2018-06-08 ENCOUNTER — Encounter (HOSPITAL_COMMUNITY): Payer: Self-pay | Admitting: Psychiatry

## 2018-06-08 DIAGNOSIS — F251 Schizoaffective disorder, depressive type: Secondary | ICD-10-CM | POA: Diagnosis not present

## 2018-06-08 MED ORDER — ARIPIPRAZOLE 10 MG PO TABS: 10.0000 mg | ORAL_TABLET | Freq: Every day | ORAL | 0 refills | Status: DC

## 2018-06-08 NOTE — Progress Notes (Signed)
[place] MD/PA/NP OP Progress Note  06/08/2018 2:30 PM Randall Mccann  MRN:  [id number]  Chief Complaint: I am doing okay.  I change my primary care physician.  HPI: Randall Mccann came for his follow-up appointment.  He is taking his medication as prescribed.  He was involved in a relationship but now he is thinking to end because he does not see that is working.  He like to be friend but he is concerned about some disagreement.  He had a good Christmas.  He visited his brother and other family member who lives in [place].  Recently he changed his primary care physician.  He denies any irritability, anger, mania or any psychosis.  Despite to many things going on in his personal life he is handling his mood very well.  He feels the current medicine working.  He denies any hallucination, suicidal thoughts or any homicidal thought.  Start using CPAP machine which is helping his sleep quality.  He is also taking medicine for his right shoulder pain.  He is not drinking or using any illegal substances.  He likes to continue his Abilify which is helping his paranoia and depression.  Visit Diagnosis:    ICD-10-CM   1. Schizoaffective disorder, depressive type ([place]) F25.1 ARIPiprazole (ABILIFY) 10 MG tablet    Past Psychiatric History: Reviewed. History of irritability, anger, paranoia and trust issues since school age.  One psychiatric admission when he was teenager and then 2 psychiatric hospitalization in 1998.  No history of suicidal attempt.  Tried lithium, Haldol, Risperdal and Paxil but do not remember the details.  History of using drugs and alcohol in the past.  Past Medical History:  Past Medical History:  Diagnosis Date  . HTN (hypertension)   . Hyperlipidemia   . Schizoaffective disorder [facility])     Past Surgical History:  Procedure Laterality Date  . ABDOMINAL HERNIA REPAIR    . KNEE SURGERY    . TONSILLECTOMY      Family Psychiatric History:Marland Kitchen  Viewed.  Family History:  Family History   Problem Relation Age of Onset  . Bipolar disorder Brother   . ADD / ADHD Brother     Social History:  Social History   Socioeconomic History  . Marital status: Unknown    Spouse name: Not on file  . Number of children: Not on file  . Years of education: Not on file  . Highest education level: Not on file  Occupational History  . Not on file  Social Needs  . Financial resource strain: Patient refused  . Food insecurity:    Worry: Patient refused    Inability: Patient refused  . Transportation needs:    Medical: Patient refused    Non-medical: Patient refused  Tobacco Use  . Smoking status: Former Research scientist (life sciences)  . Smokeless tobacco: Never Used  Substance and Sexual Activity  . Alcohol use: No    Alcohol/week: 0.0 standard drinks  . Drug use: No  . Sexual activity: Yes    Partners: Female    Birth control/protection: Condom  Lifestyle  . Physical activity:    Days per week: Patient refused    Minutes per session: Patient refused  . Stress: Patient refused  Relationships  . Social connections:    Talks on phone: Patient refused    Gets together: Patient refused    Attends religious service: Patient refused    Active member of club or organization: Patient refused    Attends meetings of clubs or organizations: Patient refused  Relationship status: Patient refused  Other Topics Concern  . Not on file  Social History Narrative  . Not on file    Allergies:  Allergies  Allergen Reactions  . Adhesive [Tape] Rash    Metabolic Disorder Labs: No results found for: HGBA1C, MPG No results found for: PROLACTIN No results found for: CHOL, TRIG, HDL, CHOLHDL, VLDL, LDLCALC No results found for: TSH  Therapeutic Level Labs: No results found for: LITHIUM No results found for: VALPROATE No components found for:  CBMZ  Current Medications: Current Outpatient Medications  Medication Sig Dispense Refill  . amLODipine (NORVASC) 10 MG tablet Take 10 mg by mouth daily.     . ARIPiprazole (ABILIFY) 10 MG tablet Take 1 tablet (10 mg total) by mouth daily. 90 tablet 0  . atorvastatin (LIPITOR) 40 MG tablet Take 40 mg by mouth daily.    Marland Kitchen lisinopril-hydrochlorothiazide (PRINZIDE,ZESTORETIC) 10-12.5 MG per tablet Take 1 tablet by mouth daily.    . metroNIDAZOLE (METROGEL) 1 % gel Apply topically daily.    . nabumetone (RELAFEN) 750 MG tablet Take 750 mg by mouth 2 (two) times daily.    . Omega-3 Fatty Acids (FISH OIL) 1000 MG CAPS Take by mouth.    Marland Kitchen omeprazole (PRILOSEC) 40 MG capsule     . Hyaluronate Sodium (BIONECT) 0.2 % CREA Apply topically.    . triamcinolone ointment (KENALOG) 0.1 %      No current facility-administered medications for this visit.      Musculoskeletal: Strength & Muscle Tone: within normal limits Gait & Station: normal Patient leans: N/A  Psychiatric Specialty Exam: ROS  Blood pressure 120/80, pulse 72, resp. rate 12, height 5' 8.5" (1.74 m), weight 236 lb 9.6 oz (107.3 kg).Body mass index is 35.45 kg/m.  General Appearance: Casual  Eye Contact:  Fair  Speech:  Clear and Coherent  Volume:  Normal  Mood:  Euthymic  Affect:  Congruent  Thought Process:  Descriptions of Associations: Intact  Orientation:  Full (Time, Place, and Person)  Thought Content: Logical   Suicidal Thoughts:  No  Homicidal Thoughts:  No  Memory:  Immediate;   Good Recent;   Good Remote;   Good  Judgement:  Good  Insight:  Good  Psychomotor Activity:  Normal  Concentration:  Concentration: Fair and Attention Span: Fair  Recall:  Good  Fund of Knowledge: Good  Language: Good  Akathisia:  No  Handed:  Right  AIMS (if indicated): not done  Assets:  Communication Skills Desire for Improvement Housing Resilience  ADL's:  Intact  Cognition: WNL  Sleep:  Fair   Screenings:   Assessment and Plan: Schizoaffective disorder, depressed type.  Patient is a stable on his current medication.  Continue Abilify 10 mg daily.  He has no tremors shakes  or any EPS.  I reminded that if he had blood work done within his new physician then sent [place] the blood results.  Patient is not interested in therapy.  Encourage healthy lifestyle and watch his calorie intake.  Encouraged to continue using CPAP which is helping his quality of sleep.  Recommended to call us back if is any question or any concern.  Follow-up in 6 months.   Kathlee Nations, MD 06/08/2018, 2:30 PM

## 2018-06-09 DIAGNOSIS — M25511 Pain in right shoulder: Secondary | ICD-10-CM | POA: Diagnosis not present

## 2018-06-09 DIAGNOSIS — G8929 Other chronic pain: Secondary | ICD-10-CM | POA: Diagnosis not present

## 2018-06-10 DIAGNOSIS — G4733 Obstructive sleep apnea (adult) (pediatric): Secondary | ICD-10-CM | POA: Diagnosis not present

## 2018-06-12 DIAGNOSIS — G4733 Obstructive sleep apnea (adult) (pediatric): Secondary | ICD-10-CM | POA: Diagnosis not present

## 2018-06-14 DIAGNOSIS — G8929 Other chronic pain: Secondary | ICD-10-CM | POA: Diagnosis not present

## 2018-06-14 DIAGNOSIS — M25511 Pain in right shoulder: Secondary | ICD-10-CM | POA: Diagnosis not present

## 2018-06-15 DIAGNOSIS — E669 Obesity, unspecified: Secondary | ICD-10-CM | POA: Diagnosis not present

## 2018-06-15 DIAGNOSIS — Z6835 Body mass index (BMI) 35.0-35.9, adult: Secondary | ICD-10-CM | POA: Diagnosis not present

## 2018-06-15 DIAGNOSIS — E7800 Pure hypercholesterolemia, unspecified: Secondary | ICD-10-CM | POA: Diagnosis not present

## 2018-06-15 DIAGNOSIS — M25511 Pain in right shoulder: Secondary | ICD-10-CM | POA: Diagnosis not present

## 2018-06-16 DIAGNOSIS — E7800 Pure hypercholesterolemia, unspecified: Secondary | ICD-10-CM | POA: Diagnosis not present

## 2018-06-23 DIAGNOSIS — M25511 Pain in right shoulder: Secondary | ICD-10-CM | POA: Diagnosis not present

## 2018-06-23 DIAGNOSIS — G8929 Other chronic pain: Secondary | ICD-10-CM | POA: Diagnosis not present

## 2018-06-27 DIAGNOSIS — G8929 Other chronic pain: Secondary | ICD-10-CM | POA: Diagnosis not present

## 2018-06-27 DIAGNOSIS — M25511 Pain in right shoulder: Secondary | ICD-10-CM | POA: Diagnosis not present

## 2018-07-07 DIAGNOSIS — G8929 Other chronic pain: Secondary | ICD-10-CM | POA: Diagnosis not present

## 2018-07-07 DIAGNOSIS — G4733 Obstructive sleep apnea (adult) (pediatric): Secondary | ICD-10-CM | POA: Diagnosis not present

## 2018-07-07 DIAGNOSIS — J342 Deviated nasal septum: Secondary | ICD-10-CM | POA: Diagnosis not present

## 2018-07-07 DIAGNOSIS — M25511 Pain in right shoulder: Secondary | ICD-10-CM | POA: Diagnosis not present

## 2018-07-11 DIAGNOSIS — M25511 Pain in right shoulder: Secondary | ICD-10-CM | POA: Diagnosis not present

## 2018-07-11 DIAGNOSIS — G4733 Obstructive sleep apnea (adult) (pediatric): Secondary | ICD-10-CM | POA: Diagnosis not present

## 2018-07-11 DIAGNOSIS — G8929 Other chronic pain: Secondary | ICD-10-CM | POA: Diagnosis not present

## 2018-07-12 ENCOUNTER — Other Ambulatory Visit (HOSPITAL_COMMUNITY): Payer: Self-pay

## 2018-07-12 DIAGNOSIS — G4733 Obstructive sleep apnea (adult) (pediatric): Secondary | ICD-10-CM | POA: Diagnosis not present

## 2018-07-18 DIAGNOSIS — G8929 Other chronic pain: Secondary | ICD-10-CM | POA: Diagnosis not present

## 2018-07-18 DIAGNOSIS — M25511 Pain in right shoulder: Secondary | ICD-10-CM | POA: Diagnosis not present

## 2018-07-22 DIAGNOSIS — J029 Acute pharyngitis, unspecified: Secondary | ICD-10-CM | POA: Diagnosis not present

## 2018-08-01 DIAGNOSIS — D485 Neoplasm of uncertain behavior of skin: Secondary | ICD-10-CM | POA: Diagnosis not present

## 2018-08-01 DIAGNOSIS — L719 Rosacea, unspecified: Secondary | ICD-10-CM | POA: Diagnosis not present

## 2018-08-04 DIAGNOSIS — M25511 Pain in right shoulder: Secondary | ICD-10-CM | POA: Diagnosis not present

## 2018-08-04 DIAGNOSIS — G8929 Other chronic pain: Secondary | ICD-10-CM | POA: Diagnosis not present

## 2018-08-04 DIAGNOSIS — G4733 Obstructive sleep apnea (adult) (pediatric): Secondary | ICD-10-CM | POA: Diagnosis not present

## 2018-08-04 DIAGNOSIS — J342 Deviated nasal septum: Secondary | ICD-10-CM | POA: Diagnosis not present

## 2018-08-09 DIAGNOSIS — G4733 Obstructive sleep apnea (adult) (pediatric): Secondary | ICD-10-CM | POA: Diagnosis not present

## 2018-08-18 DIAGNOSIS — G4733 Obstructive sleep apnea (adult) (pediatric): Secondary | ICD-10-CM | POA: Diagnosis not present

## 2018-09-09 DIAGNOSIS — G4733 Obstructive sleep apnea (adult) (pediatric): Secondary | ICD-10-CM | POA: Diagnosis not present

## 2018-09-11 DIAGNOSIS — G4733 Obstructive sleep apnea (adult) (pediatric): Secondary | ICD-10-CM | POA: Diagnosis not present

## 2018-09-11 DIAGNOSIS — D571 Sickle-cell disease without crisis: Secondary | ICD-10-CM | POA: Diagnosis not present

## 2018-09-13 DIAGNOSIS — I10 Essential (primary) hypertension: Secondary | ICD-10-CM | POA: Diagnosis not present

## 2018-09-13 DIAGNOSIS — G8929 Other chronic pain: Secondary | ICD-10-CM | POA: Diagnosis not present

## 2018-09-13 DIAGNOSIS — Z79899 Other long term (current) drug therapy: Secondary | ICD-10-CM | POA: Diagnosis not present

## 2018-09-13 DIAGNOSIS — E669 Obesity, unspecified: Secondary | ICD-10-CM | POA: Diagnosis not present

## 2018-09-13 DIAGNOSIS — E7800 Pure hypercholesterolemia, unspecified: Secondary | ICD-10-CM | POA: Diagnosis not present

## 2018-09-13 DIAGNOSIS — Z6836 Body mass index (BMI) 36.0-36.9, adult: Secondary | ICD-10-CM | POA: Diagnosis not present

## 2018-09-13 DIAGNOSIS — G4730 Sleep apnea, unspecified: Secondary | ICD-10-CM | POA: Diagnosis not present

## 2018-09-18 DIAGNOSIS — R234 Changes in skin texture: Secondary | ICD-10-CM | POA: Diagnosis not present

## 2018-09-19 ENCOUNTER — Other Ambulatory Visit: Payer: Self-pay

## 2018-09-19 ENCOUNTER — Ambulatory Visit (INDEPENDENT_AMBULATORY_CARE_PROVIDER_SITE_OTHER): Payer: PPO | Admitting: Podiatry

## 2018-09-19 ENCOUNTER — Encounter: Payer: Self-pay | Admitting: Podiatry

## 2018-09-19 VITALS — Temp 97.3°F

## 2018-09-19 DIAGNOSIS — R234 Changes in skin texture: Secondary | ICD-10-CM | POA: Diagnosis not present

## 2018-09-19 NOTE — Progress Notes (Signed)
This patient presents to the office stating that he has pain in the back of his right heel for about 1 week.  He states that he has developed fissures in the back of his right foot and believes there is 1 area where there is a foreign body.  He states he has occasional pain when walking but most of the pain is applying his shoes and laying in bed on his back and placing his right heel on the bed.  He presents the office today for an evaluation and treatment of this painful condition  General Appearance  Alert, conversant and in no acute stress.  Vascular  Dorsalis pedis and posterior tibial  pulses are palpable  bilaterally.  Capillary return is within normal limits  bilaterally. Temperature is within normal limits  bilaterally.  Neurologic  Senn-Weinstein monofilament wire test within normal limits  bilaterally. Muscle power within normal limits bilaterally.  Nails Thick disfigured discolored nails with subungual debris  from hallux to fifth toes bilaterally. No evidence of bacterial infection or drainage bilaterally.  Orthopedic  No limitations of motion of motion feet .  No crepitus or effusions noted.  No bony pathology or digital deformities noted. Functional hallux limitus 1st MPJ  B/L.  No pain elicited left heel.  Skin  normotropic skin with no porokeratosis noted bilaterally.  No signs of infections or ulcers noted.  Multiple fissures are noted on the plantar posterior aspect of the right heel.  No evidence of any drainage redness or infection.   Fissures right heel  ROV.  Discussed this condition with this patient.  Patient was told to use Vaseline on his right heel to moisturize the fissures.  He was also told to use Chapstick on  the individual fissures.  Patient was also instructed to discontinue his heart orthoses until his heel improves.  Patient to return to the office if an infection sets in his heel.   Gardiner Barefoot DPM

## 2018-09-26 DIAGNOSIS — L719 Rosacea, unspecified: Secondary | ICD-10-CM | POA: Diagnosis not present

## 2018-09-27 DIAGNOSIS — K210 Gastro-esophageal reflux disease with esophagitis: Secondary | ICD-10-CM | POA: Diagnosis not present

## 2018-10-05 ENCOUNTER — Other Ambulatory Visit (HOSPITAL_COMMUNITY): Payer: Self-pay | Admitting: Psychiatry

## 2018-10-05 DIAGNOSIS — F251 Schizoaffective disorder, depressive type: Secondary | ICD-10-CM

## 2018-10-09 DIAGNOSIS — E119 Type 2 diabetes mellitus without complications: Secondary | ICD-10-CM | POA: Diagnosis not present

## 2018-10-09 DIAGNOSIS — G4733 Obstructive sleep apnea (adult) (pediatric): Secondary | ICD-10-CM | POA: Diagnosis not present

## 2018-10-12 ENCOUNTER — Other Ambulatory Visit (HOSPITAL_COMMUNITY): Payer: Self-pay

## 2018-10-12 DIAGNOSIS — F251 Schizoaffective disorder, depressive type: Secondary | ICD-10-CM

## 2018-10-12 MED ORDER — ARIPIPRAZOLE 10 MG PO TABS: 10.0000 mg | ORAL_TABLET | Freq: Every day | ORAL | 0 refills | Status: DC

## 2018-10-24 DIAGNOSIS — R5381 Other malaise: Secondary | ICD-10-CM | POA: Diagnosis not present

## 2018-10-24 DIAGNOSIS — R0982 Postnasal drip: Secondary | ICD-10-CM | POA: Diagnosis not present

## 2018-10-24 DIAGNOSIS — R1310 Dysphagia, unspecified: Secondary | ICD-10-CM | POA: Diagnosis not present

## 2018-10-24 DIAGNOSIS — R51 Headache: Secondary | ICD-10-CM | POA: Diagnosis not present

## 2018-10-24 DIAGNOSIS — Z6836 Body mass index (BMI) 36.0-36.9, adult: Secondary | ICD-10-CM | POA: Diagnosis not present

## 2018-11-02 DIAGNOSIS — G4710 Hypersomnia, unspecified: Secondary | ICD-10-CM | POA: Diagnosis not present

## 2018-11-02 DIAGNOSIS — G4733 Obstructive sleep apnea (adult) (pediatric): Secondary | ICD-10-CM | POA: Diagnosis not present

## 2018-11-07 ENCOUNTER — Other Ambulatory Visit (HOSPITAL_COMMUNITY)
Admission: RE | Admit: 2018-11-07 | Discharge: 2018-11-07 | Disposition: A | Discharge status: Home / Self Care | Payer: PPO | Source: Ambulatory Visit | Attending: Gastroenterology | Admitting: Gastroenterology

## 2018-11-07 ENCOUNTER — Inpatient Hospital Stay (HOSPITAL_COMMUNITY): Admission: RE | Admit: 2018-11-07 | Payer: PPO | Source: Ambulatory Visit

## 2018-11-07 DIAGNOSIS — B3781 Candidal esophagitis: Secondary | ICD-10-CM | POA: Diagnosis not present

## 2018-11-07 DIAGNOSIS — R1310 Dysphagia, unspecified: Secondary | ICD-10-CM | POA: Diagnosis not present

## 2018-11-07 DIAGNOSIS — K228 Other specified diseases of esophagus: Secondary | ICD-10-CM | POA: Diagnosis not present

## 2018-11-07 DIAGNOSIS — K229 Disease of esophagus, unspecified: Secondary | ICD-10-CM | POA: Diagnosis not present

## 2018-11-07 DIAGNOSIS — B379 Candidiasis, unspecified: Secondary | ICD-10-CM | POA: Diagnosis not present

## 2018-11-07 DIAGNOSIS — K449 Diaphragmatic hernia without obstruction or gangrene: Secondary | ICD-10-CM | POA: Diagnosis not present

## 2018-11-07 DIAGNOSIS — K219 Gastro-esophageal reflux disease without esophagitis: Secondary | ICD-10-CM | POA: Diagnosis not present

## 2018-11-09 DIAGNOSIS — G4733 Obstructive sleep apnea (adult) (pediatric): Secondary | ICD-10-CM | POA: Diagnosis not present

## 2018-11-10 DIAGNOSIS — K219 Gastro-esophageal reflux disease without esophagitis: Secondary | ICD-10-CM | POA: Diagnosis not present

## 2018-11-13 DIAGNOSIS — G4733 Obstructive sleep apnea (adult) (pediatric): Secondary | ICD-10-CM | POA: Diagnosis not present

## 2018-11-13 DIAGNOSIS — Z6835 Body mass index (BMI) 35.0-35.9, adult: Secondary | ICD-10-CM | POA: Diagnosis not present

## 2018-11-13 DIAGNOSIS — J342 Deviated nasal septum: Secondary | ICD-10-CM | POA: Diagnosis not present

## 2018-11-16 DIAGNOSIS — G4730 Sleep apnea, unspecified: Secondary | ICD-10-CM | POA: Diagnosis not present

## 2018-11-16 DIAGNOSIS — Z6836 Body mass index (BMI) 36.0-36.9, adult: Secondary | ICD-10-CM | POA: Diagnosis not present

## 2018-11-16 DIAGNOSIS — G4733 Obstructive sleep apnea (adult) (pediatric): Secondary | ICD-10-CM | POA: Diagnosis not present

## 2018-11-28 DIAGNOSIS — G4719 Other hypersomnia: Secondary | ICD-10-CM | POA: Diagnosis not present

## 2018-11-28 DIAGNOSIS — G4733 Obstructive sleep apnea (adult) (pediatric): Secondary | ICD-10-CM | POA: Diagnosis not present

## 2018-12-07 ENCOUNTER — Other Ambulatory Visit: Payer: Self-pay

## 2018-12-07 ENCOUNTER — Encounter (HOSPITAL_COMMUNITY): Payer: Self-pay | Admitting: Psychiatry

## 2018-12-07 ENCOUNTER — Ambulatory Visit (INDEPENDENT_AMBULATORY_CARE_PROVIDER_SITE_OTHER): Payer: PPO | Admitting: Psychiatry

## 2018-12-07 DIAGNOSIS — F251 Schizoaffective disorder, depressive type: Secondary | ICD-10-CM

## 2018-12-07 MED ORDER — ARIPIPRAZOLE 10 MG PO TABS: 10.0000 mg | ORAL_TABLET | Freq: Every day | ORAL | 0 refills | Status: DC

## 2018-12-07 NOTE — Progress Notes (Signed)
Virtual Visit via Telephone Note  I connected with Randall Mccann on 12/07/18 at  2:00 PM EDT by telephone and verified that I am speaking with the correct person using two identifiers.   I discussed the limitations, risks, security and privacy concerns of performing an evaluation and management service by telephone and the availability of in person appointments. I also discussed with the patient that there may be a patient responsible charge related to this service. The patient expressed understanding and agreed to proceed.   History of Present Illness: Patient was evaluated by phone session.  He is taking his medication but there are times when he gets irritable.  He is struggling with his sleep.  He has changed his sleep doctor and now seen his Randall Mccann because he was not happy with his previous doctor.  He is taking modafinil and CPAP machine but sometimes he sleeps during the day and sometimes he does not sleep at night.  He admitted sometimes irritability but denies any mania, psychosis or any hallucination.  He does not leave his house unless it is important.  He feels the current Abilify working very well.  He does not want to change the medication.  He has no tremors, shakes or any EPS.  He recently had a physical on April 15.  He told his hemoglobin A1c was 5.9.  He admitted some weight gain due to COVID-19 as he does not go outside at all.  However his energy level is good.  He denies any crying spells or any feeling of hopelessness.  Denies drinking or using any illegal substances.  Past Psychiatric History: Reviewed. History of irritability, anger, paranoia and trust issues since school age.  One psychiatric admission as teenager and then twice inpatient in 1998.  No history of suicidal attempt.  Tried lithium, Haldol, Risperdal and Paxil but do not remember the details. H/O using drugs and alcohol in the past.   Psychiatric Specialty Exam: Physical Exam  ROS  There were no  vitals taken for this visit.There is no height or weight on file to calculate BMI.  General Appearance: NA  Eye Contact:  NA  Speech:  Clear and Coherent and Normal Rate  Volume:  Normal  Mood:  Euthymic  Affect:  NA  Thought Process:  Goal Directed  Orientation:  Full (Time, Place, and Person)  Thought Content:  WDL and Logical  Suicidal Thoughts:  No  Homicidal Thoughts:  No  Memory:  Immediate;   Good Recent;   Good Remote;   Good  Judgement:  Good  Insight:  Good  Psychomotor Activity:  NA  Concentration:  Concentration: Fair and Attention Span: Fair  Recall:  Good  Fund of Knowledge:  Good  Language:  Good  Akathisia:  No  Handed:  Right  AIMS (if indicated):     Assets:  Communication Skills Desire for Improvement Housing Resilience  ADL's:  Intact  Cognition:  WNL  Sleep:   fair      Assessment and Plan: Schizoaffective disorder, depressed type.  Discussed his blood work results.  His hemoglobin A1c is 5.9.  Encouraged to keep appointment with his sleep physician as patient is struggling with insomnia and then hypersomnia.  Patient has no trouble taking the medication and denies any shakes, tremors or EPS.  He is not interested in therapy.  Recommended to call us back if he has any question or any concern.  Follow-up in 3 months.  Follow Up Instructions:  I discussed the assessment and treatment plan with the patient. The patient was provided an opportunity to ask questions and all were answered. The patient agreed with the plan and demonstrated an understanding of the instructions.   The patient was advised to call back or seek an in-person evaluation if the symptoms worsen or if the condition fails to improve as anticipated.  I provided 20 minutes of non-face-to-face time during this encounter.   Kathlee Nations, MD

## 2018-12-08 DIAGNOSIS — K210 Gastro-esophageal reflux disease with esophagitis: Secondary | ICD-10-CM | POA: Diagnosis not present

## 2018-12-09 DIAGNOSIS — G4733 Obstructive sleep apnea (adult) (pediatric): Secondary | ICD-10-CM | POA: Diagnosis not present

## 2018-12-12 DIAGNOSIS — E669 Obesity, unspecified: Secondary | ICD-10-CM | POA: Diagnosis not present

## 2018-12-12 DIAGNOSIS — Z6836 Body mass index (BMI) 36.0-36.9, adult: Secondary | ICD-10-CM | POA: Diagnosis not present

## 2018-12-12 DIAGNOSIS — G4730 Sleep apnea, unspecified: Secondary | ICD-10-CM | POA: Diagnosis not present

## 2018-12-12 DIAGNOSIS — I10 Essential (primary) hypertension: Secondary | ICD-10-CM | POA: Diagnosis not present

## 2018-12-12 DIAGNOSIS — E7800 Pure hypercholesterolemia, unspecified: Secondary | ICD-10-CM | POA: Diagnosis not present

## 2018-12-26 DIAGNOSIS — G4733 Obstructive sleep apnea (adult) (pediatric): Secondary | ICD-10-CM | POA: Diagnosis not present

## 2018-12-26 DIAGNOSIS — L719 Rosacea, unspecified: Secondary | ICD-10-CM | POA: Diagnosis not present

## 2018-12-28 DIAGNOSIS — G4733 Obstructive sleep apnea (adult) (pediatric): Secondary | ICD-10-CM | POA: Diagnosis not present

## 2019-01-09 DIAGNOSIS — G4733 Obstructive sleep apnea (adult) (pediatric): Secondary | ICD-10-CM | POA: Diagnosis not present

## 2019-01-15 DIAGNOSIS — L918 Other hypertrophic disorders of the skin: Secondary | ICD-10-CM | POA: Diagnosis not present

## 2019-01-15 DIAGNOSIS — L578 Other skin changes due to chronic exposure to nonionizing radiation: Secondary | ICD-10-CM | POA: Diagnosis not present

## 2019-01-15 DIAGNOSIS — D485 Neoplasm of uncertain behavior of skin: Secondary | ICD-10-CM | POA: Diagnosis not present

## 2019-01-15 DIAGNOSIS — D225 Melanocytic nevi of trunk: Secondary | ICD-10-CM | POA: Diagnosis not present

## 2019-01-15 DIAGNOSIS — D1801 Hemangioma of skin and subcutaneous tissue: Secondary | ICD-10-CM | POA: Diagnosis not present

## 2019-01-23 ENCOUNTER — Other Ambulatory Visit (HOSPITAL_BASED_OUTPATIENT_CLINIC_OR_DEPARTMENT_OTHER): Payer: Self-pay

## 2019-01-23 DIAGNOSIS — G4710 Hypersomnia, unspecified: Secondary | ICD-10-CM

## 2019-01-23 DIAGNOSIS — G4733 Obstructive sleep apnea (adult) (pediatric): Secondary | ICD-10-CM

## 2019-01-27 DIAGNOSIS — G4733 Obstructive sleep apnea (adult) (pediatric): Secondary | ICD-10-CM | POA: Diagnosis not present

## 2019-02-06 ENCOUNTER — Other Ambulatory Visit: Payer: Self-pay

## 2019-02-06 ENCOUNTER — Encounter (INDEPENDENT_AMBULATORY_CARE_PROVIDER_SITE_OTHER): Payer: Self-pay

## 2019-02-06 ENCOUNTER — Ambulatory Visit (HOSPITAL_BASED_OUTPATIENT_CLINIC_OR_DEPARTMENT_OTHER): Payer: PPO | Attending: Internal Medicine | Admitting: Internal Medicine

## 2019-02-06 DIAGNOSIS — Z791 Long term (current) use of non-steroidal anti-inflammatories (NSAID): Secondary | ICD-10-CM | POA: Insufficient documentation

## 2019-02-06 DIAGNOSIS — G4733 Obstructive sleep apnea (adult) (pediatric): Secondary | ICD-10-CM | POA: Diagnosis not present

## 2019-02-06 DIAGNOSIS — Z79899 Other long term (current) drug therapy: Secondary | ICD-10-CM | POA: Diagnosis not present

## 2019-02-06 DIAGNOSIS — G4710 Hypersomnia, unspecified: Secondary | ICD-10-CM | POA: Insufficient documentation

## 2019-02-07 ENCOUNTER — Ambulatory Visit (HOSPITAL_BASED_OUTPATIENT_CLINIC_OR_DEPARTMENT_OTHER): Payer: PPO | Attending: Internal Medicine | Admitting: Internal Medicine

## 2019-02-07 DIAGNOSIS — G4710 Hypersomnia, unspecified: Secondary | ICD-10-CM

## 2019-02-07 DIAGNOSIS — Z6837 Body mass index (BMI) 37.0-37.9, adult: Secondary | ICD-10-CM | POA: Insufficient documentation

## 2019-02-07 DIAGNOSIS — Z791 Long term (current) use of non-steroidal anti-inflammatories (NSAID): Secondary | ICD-10-CM | POA: Diagnosis not present

## 2019-02-07 DIAGNOSIS — G4733 Obstructive sleep apnea (adult) (pediatric): Secondary | ICD-10-CM | POA: Diagnosis not present

## 2019-02-07 DIAGNOSIS — E669 Obesity, unspecified: Secondary | ICD-10-CM | POA: Insufficient documentation

## 2019-02-07 DIAGNOSIS — G4711 Idiopathic hypersomnia with long sleep time: Secondary | ICD-10-CM | POA: Diagnosis not present

## 2019-02-07 DIAGNOSIS — Z79899 Other long term (current) drug therapy: Secondary | ICD-10-CM | POA: Diagnosis not present

## 2019-02-08 DIAGNOSIS — G4719 Other hypersomnia: Secondary | ICD-10-CM | POA: Diagnosis not present

## 2019-02-09 DIAGNOSIS — G4733 Obstructive sleep apnea (adult) (pediatric): Secondary | ICD-10-CM | POA: Diagnosis not present

## 2019-02-11 DIAGNOSIS — G4733 Obstructive sleep apnea (adult) (pediatric): Secondary | ICD-10-CM | POA: Diagnosis not present

## 2019-02-11 DIAGNOSIS — G4711 Idiopathic hypersomnia with long sleep time: Secondary | ICD-10-CM | POA: Diagnosis not present

## 2019-02-11 NOTE — Procedures (Signed)
    NAME: Randall Mccann DATE OF BIRTH:  September 10, 1965 MEDICAL RECORD NUMBER [id number]  LOCATION: [facility]  PHYSICIAN: Marius Ditch  DATE OF STUDY: 02/07/2019  SLEEP STUDY TYPE: Multiple Sleep Latency Test               REFERRING PHYSICIAN: Marius Ditch, MD  INDICATION FOR STUDY: Excessive daytime sleepiness despite adequate controlled OSA  EPWORTH SLEEPINESS SCORE:  17 HEIGHT: 5\' 8"  (172.7 cm)  WEIGHT: 245 lb (111.1 kg)    Body mass index is 37.25 kg/m.  NECK SIZE: 17 in.  MEDICATIONS  Patient self administered medications include: AMLODIPINE, DOXYCYCLINE HYCLATE, LISINOPRIL/HCTZ, NABUMETONE. Medications administered during study include No sleep medicine administered.Marland Kitchen   SLEEP STUDY TECHNIQUE  A multiple sleep latency test was performed. The channels recorded and monitored were central and occipital EEG, electrooculogram (EOG), submentalis EMG (chin), and electrocardiogram.   TECHNICAL COMMENTS  Comments added by Technician: NONE Comments added by Scorer: N/A   IMPRESSIONS  No sleep onset REMs present. This study does not suggest narcolepsy. Total number of naps attempted: 5 . Total number of naps with sleep attained: 5 Pathologic sleepiness was evidenced by short mean sleep latency of 1:20 minutes  DIAGNOSIS  Pathologic Sleepiness  Idiopathic hypersomnia (327.11 [G47.11 ICD-10])  RECOMMENDATIONS  Return for follow up and management of Idiopathic Hypersomnia.  Return for follow up to evaluate other causes of excessive daytime sleepiness.    Marius Ditch Sleep specialist, [place] Board of [employer] Medicine  ELECTRONICALLY SIGNED ON:  02/11/2019, 4:24 PM [place] PH: [phone]   FX: [phone] [place]

## 2019-02-11 NOTE — Procedures (Signed)
   NAME: Randall Mccann DATE OF BIRTH:  03-Aug-1965 MEDICAL RECORD NUMBER [id number]  LOCATION: [facility]  PHYSICIAN: Marius Ditch  DATE OF STUDY: 02/06/2019  SLEEP STUDY TYPE: Positive Airway Pressure Titration               REFERRING PHYSICIAN: Marius Ditch, MD  INDICATION FOR STUDY: Pre-MSLT testing with CPAP. He is currently on CPAP 16 at home with good airway control.   EPWORTH SLEEPINESS SCORE:  17 HEIGHT: 5' 8.5" (174 cm)  WEIGHT: 245 lb (111.1 kg)    Body mass index is 36.71 kg/m.  NECK SIZE: 17 in.  MEDICATIONS  Patient self administered medications include: AMLODIPINE, DOXYCYCLINE HYCLATE, LISINOPRIL/HCTZ, NABUMETONE. Medications administered during study include No sleep medicine administered.Marland Kitchen   SLEEP STUDY TECHNIQUE  The patient underwent an attended overnight polysomnography titration to assess the effects of cpap therapy. The following variables were monitored: EEG(C4-A1, C3-A2, O1-A2, O2-A1), EOG, submental and leg EMG, ECG, oxyhemoglobin saturation by pulse oximetry, thoracic and abdominal respiratory effort belts, nasal/oral airflow by pressure sensor, body position sensor and snoring sensor. CPAP pressure was titrated to eliminate apneas, hypopneas and oxygen desaturation.   TECHNICAL COMMENTS  Comments added by Technician: NONE Comments added by Scorer: N/A   SLEEP ARCHITECTURE  The study was initiated at 10:44:26 PM and terminated at 6:00:26 AM. Total recorded time was 436 minutes. EEG confirmed total sleep time was 406 minutes yielding a sleep efficiency of 93.1%%. Sleep onset after lights out was 2.2 minutes with a REM latency of 47.5 minutes. The patient spent 3.3%% of the night in stage N1 sleep, 69.8%% in stage N2 sleep, 0.0%% in stage N3 and 26.9% in REM. The Arousal Index was 6.1/hour.  RESPIRATORY PARAMETERS  The overall AHI was 0.6 per hour, and the RDI was 0.7 events/hour with a central apnea index of 0.0 per hour.  The most appropriate setting of CPAP was IPAP/EPAP 15/15 cm H2O. The oxygen nadir was 91.0% during sleep.  LEG MOVEMENT DATA  The total leg movements were 0 with a resulting leg movement index of 0.0/hr. Associated arousal with leg movement index was 0.0/hr.   CARDIAC DATA  The underlying cardiac rhythm was most consistent with sinus rhythm. Mean heart rate during sleep was 71.0 bpm. Additional rhythm abnormalities include None.   IMPRESSIONS  Excellent airway control with auto CPAP.  Slightly low REM Sleep onset latency High sleep efficiency May proceed with MSLT  DIAGNOSIS  Obstructive Sleep Apnea (327.23 [G47.33 ICD-10]) with persisting hypersomnia despite adequate airway control.   RECOMMENDATIONS  Proceed with MSLT.    Marius Ditch Sleep specialist, [place] Board of Internal Medicine  ELECTRONICALLY SIGNED ON:  02/11/2019, 4:01 PM [place] PH: [phone]   FX: [phone] [place]

## 2019-02-13 DIAGNOSIS — G4733 Obstructive sleep apnea (adult) (pediatric): Secondary | ICD-10-CM | POA: Diagnosis not present

## 2019-02-13 DIAGNOSIS — G4712 Idiopathic hypersomnia without long sleep time: Secondary | ICD-10-CM | POA: Diagnosis not present

## 2019-02-21 DIAGNOSIS — M545 Low back pain: Secondary | ICD-10-CM | POA: Diagnosis not present

## 2019-02-22 DIAGNOSIS — Z7189 Other specified counseling: Secondary | ICD-10-CM | POA: Diagnosis not present

## 2019-02-22 DIAGNOSIS — Z125 Encounter for screening for malignant neoplasm of prostate: Secondary | ICD-10-CM | POA: Diagnosis not present

## 2019-02-22 DIAGNOSIS — E669 Obesity, unspecified: Secondary | ICD-10-CM | POA: Diagnosis not present

## 2019-02-22 DIAGNOSIS — Z0000 Encounter for general adult medical examination without abnormal findings: Secondary | ICD-10-CM | POA: Diagnosis not present

## 2019-02-22 DIAGNOSIS — Z6837 Body mass index (BMI) 37.0-37.9, adult: Secondary | ICD-10-CM | POA: Diagnosis not present

## 2019-02-22 DIAGNOSIS — Z1331 Encounter for screening for depression: Secondary | ICD-10-CM | POA: Diagnosis not present

## 2019-02-22 DIAGNOSIS — Z1339 Encounter for screening examination for other mental health and behavioral disorders: Secondary | ICD-10-CM | POA: Diagnosis not present

## 2019-02-27 DIAGNOSIS — Z205 Contact with and (suspected) exposure to viral hepatitis: Secondary | ICD-10-CM | POA: Diagnosis not present

## 2019-03-01 DIAGNOSIS — M5416 Radiculopathy, lumbar region: Secondary | ICD-10-CM | POA: Diagnosis not present

## 2019-03-02 DIAGNOSIS — G4733 Obstructive sleep apnea (adult) (pediatric): Secondary | ICD-10-CM | POA: Diagnosis not present

## 2019-03-10 DIAGNOSIS — R7989 Other specified abnormal findings of blood chemistry: Secondary | ICD-10-CM | POA: Diagnosis not present

## 2019-03-10 DIAGNOSIS — R748 Abnormal levels of other serum enzymes: Secondary | ICD-10-CM | POA: Diagnosis not present

## 2019-03-11 DIAGNOSIS — G4733 Obstructive sleep apnea (adult) (pediatric): Secondary | ICD-10-CM | POA: Diagnosis not present

## 2019-03-12 ENCOUNTER — Encounter (HOSPITAL_COMMUNITY): Payer: Self-pay | Admitting: Psychiatry

## 2019-03-12 ENCOUNTER — Other Ambulatory Visit: Payer: Self-pay

## 2019-03-12 ENCOUNTER — Ambulatory Visit (INDEPENDENT_AMBULATORY_CARE_PROVIDER_SITE_OTHER): Payer: PPO | Admitting: Psychiatry

## 2019-03-12 DIAGNOSIS — F251 Schizoaffective disorder, depressive type: Secondary | ICD-10-CM

## 2019-03-12 DIAGNOSIS — M545 Low back pain, unspecified: Secondary | ICD-10-CM | POA: Insufficient documentation

## 2019-03-12 MED ORDER — ARIPIPRAZOLE 10 MG PO TABS: 10.0000 mg | ORAL_TABLET | Freq: Every day | ORAL | 0 refills | Status: DC

## 2019-03-12 NOTE — Progress Notes (Signed)
Virtual Visit via Telephone Note  I connected with Randall Mccann on 03/12/19 at  2:20 PM EDT by telephone and verified that I am speaking with the correct person using two identifiers.   I discussed the limitations, risks, security and privacy concerns of performing an evaluation and management service by telephone and the availability of in person appointments. I also discussed with the patient that there may be a patient responsible charge related to this service. The patient expressed understanding and agreed to proceed.   History of Present Illness: Patient was evaluated by phone session.  He is taking his medication as prescribed.  Sometimes he watch television and got upset and irritated but otherwise he reported his mood is been stable.  He is working with Dr. Nehemiah Settle for his insomnia.  He is taking modafinil helping his hypersomnia.  He denies any paranoia or any hallucination.  He is working on his sleep hygiene which does help if he follows.  He denies any suicidal thoughts or homicidal thought.  He denies any anger, highs and lows in his mood.  His energy level is good.  His appetite is okay.  Denies drinking or using any illegal substances.  He does not leave the house unless it is important.  He also checks on his parents who lives close by.  He wanted to avoid unnecessary public places due to father has we community.  He like to continue Abilify.  He has no tremors shakes or any EPS.  Past Psychiatric History:Reviewed. History of irritability, anger, paranoia and trust issues since school age. One psychiatric admission as teenager and then twice inpatient in 1998. No history of suicidal attempt. Tried lithium, Haldol, Risperdal and Paxil but do not remember the details. H/O using drugs and alcohol in the past.   Psychiatric Specialty Exam: Physical Exam  ROS  There were no vitals taken for this visit.There is no height or weight on file to calculate BMI.  General  Appearance: NA  Eye Contact:  NA  Speech:  Clear and Coherent and Normal Rate  Volume:  Normal  Mood:  Euthymic  Affect:  NA  Thought Process:  Goal Directed  Orientation:  Full (Time, Place, and Person)  Thought Content:  WDL  Suicidal Thoughts:  No  Homicidal Thoughts:  No  Memory:  Immediate;   Good Recent;   Good Remote;   Good  Judgement:  Good  Insight:  Good  Psychomotor Activity:  NA  Concentration:  Concentration: Good and Attention Span: Good  Recall:  Good  Fund of Knowledge:  Good  Language:  Good  Akathisia:  No  Handed:  Right  AIMS (if indicated):     Assets:  Communication Skills Desire for Improvement Housing  ADL's:  Intact  Cognition:  WNL  Sleep:   fair      Assessment and Plan: Schizoaffective disorder, depressed type.  Patient is a stable on his current medication.  I reviewed recent sleep study from Dr. Jeneen Rinks office phone.  He is following sleep hygiene and taking modafinil. Like to continue Abilify which is helping his mood irritability paranoia.  Encourage healthy exercise and watch his calorie intake and do regular walking 10 to 20 minutes 3 times a week.  Continue Abilify 10 mg daily.  Recommended to call us back if is any question of any concern.  He is not interested in therapy.  Follow-up in 3 months.  Follow Up Instructions:    I discussed the assessment and  treatment plan with the patient. The patient was provided an opportunity to ask questions and all were answered. The patient agreed with the plan and demonstrated an understanding of the instructions.   The patient was advised to call back or seek an in-person evaluation if the symptoms worsen or if the condition fails to improve as anticipated.  I provided 20 minutes of non-face-to-face time during this encounter.   Kathlee Nations, MD

## 2019-03-15 DIAGNOSIS — R7303 Prediabetes: Secondary | ICD-10-CM | POA: Diagnosis not present

## 2019-03-15 DIAGNOSIS — K760 Fatty (change of) liver, not elsewhere classified: Secondary | ICD-10-CM | POA: Diagnosis not present

## 2019-03-20 DIAGNOSIS — L918 Other hypertrophic disorders of the skin: Secondary | ICD-10-CM | POA: Diagnosis not present

## 2019-03-20 DIAGNOSIS — L719 Rosacea, unspecified: Secondary | ICD-10-CM | POA: Diagnosis not present

## 2019-03-20 DIAGNOSIS — D225 Melanocytic nevi of trunk: Secondary | ICD-10-CM | POA: Diagnosis not present

## 2019-03-20 DIAGNOSIS — D2239 Melanocytic nevi of other parts of face: Secondary | ICD-10-CM | POA: Diagnosis not present

## 2019-03-26 DIAGNOSIS — D485 Neoplasm of uncertain behavior of skin: Secondary | ICD-10-CM | POA: Diagnosis not present

## 2019-03-28 DIAGNOSIS — G4733 Obstructive sleep apnea (adult) (pediatric): Secondary | ICD-10-CM | POA: Diagnosis not present

## 2019-04-03 DIAGNOSIS — M545 Low back pain: Secondary | ICD-10-CM | POA: Diagnosis not present

## 2019-04-11 DIAGNOSIS — G4733 Obstructive sleep apnea (adult) (pediatric): Secondary | ICD-10-CM | POA: Diagnosis not present

## 2019-05-01 DIAGNOSIS — G4733 Obstructive sleep apnea (adult) (pediatric): Secondary | ICD-10-CM | POA: Diagnosis not present

## 2019-05-03 DIAGNOSIS — L304 Erythema intertrigo: Secondary | ICD-10-CM | POA: Diagnosis not present

## 2019-05-08 ENCOUNTER — Telehealth: Payer: Self-pay | Admitting: *Deleted

## 2019-05-08 NOTE — Telephone Encounter (Signed)
Pt asked how his prescription was to be taken and I read the 07/06/2017 Nabumetone instructions as  "take 750mg  by mouth 2 times daily and Nabumetone tablet was 750mg .

## 2019-05-08 NOTE — Telephone Encounter (Signed)
Pt called states he would like to ask a question concerning his nabumetone.

## 2019-05-11 DIAGNOSIS — G4733 Obstructive sleep apnea (adult) (pediatric): Secondary | ICD-10-CM | POA: Diagnosis not present

## 2019-05-31 DIAGNOSIS — G4712 Idiopathic hypersomnia without long sleep time: Secondary | ICD-10-CM | POA: Diagnosis not present

## 2019-05-31 DIAGNOSIS — G4733 Obstructive sleep apnea (adult) (pediatric): Secondary | ICD-10-CM | POA: Diagnosis not present

## 2019-06-04 DIAGNOSIS — G4733 Obstructive sleep apnea (adult) (pediatric): Secondary | ICD-10-CM | POA: Diagnosis not present

## 2019-06-05 DIAGNOSIS — L304 Erythema intertrigo: Secondary | ICD-10-CM | POA: Diagnosis not present

## 2019-06-05 DIAGNOSIS — L821 Other seborrheic keratosis: Secondary | ICD-10-CM | POA: Diagnosis not present

## 2019-06-05 DIAGNOSIS — M545 Low back pain: Secondary | ICD-10-CM | POA: Diagnosis not present

## 2019-06-12 ENCOUNTER — Ambulatory Visit (HOSPITAL_COMMUNITY): Payer: PPO | Admitting: Psychiatry

## 2019-06-14 ENCOUNTER — Other Ambulatory Visit: Payer: Self-pay

## 2019-06-14 ENCOUNTER — Encounter (HOSPITAL_COMMUNITY): Payer: Self-pay | Admitting: Psychiatry

## 2019-06-14 ENCOUNTER — Ambulatory Visit (INDEPENDENT_AMBULATORY_CARE_PROVIDER_SITE_OTHER): Payer: PPO | Admitting: Psychiatry

## 2019-06-14 DIAGNOSIS — F251 Schizoaffective disorder, depressive type: Secondary | ICD-10-CM

## 2019-06-14 MED ORDER — ARIPIPRAZOLE 10 MG PO TABS: 10.0000 mg | ORAL_TABLET | Freq: Every day | ORAL | 0 refills | Status: DC

## 2019-06-14 NOTE — Progress Notes (Signed)
Virtual Visit via Telephone Note  I connected with Randall Mccann on 06/14/19 at  3:20 PM EST by telephone and verified that I am speaking with the correct person using two identifiers.   I discussed the limitations, risks, security and privacy concerns of performing an evaluation and management service by telephone and the availability of in person appointments. I also discussed with the patient that there may be a patient responsible charge related to this service. The patient expressed understanding and agreed to proceed.   History of Present Illness: Patient was evaluated by phone session.  He has been taking his Abilify and reported it is working well.  He is concerned about political situation and Covid cases and he had stopped watching news.  He is sleeping good since taking modafinil.  He really working his sleep hygiene and that seems to be working very well.  He continues to walk 10 to 15 minutes 3 times a week.  He was able to lost another 5 pounds.  He denies any anger, hallucination, agitation.  He admitted chronic trust issues and does not like to mingle with people.  He does not leave his house unless it is important.  He checks his parents who lives close by.  He has no tremors shakes or any EPS.  He like to continue with his Abilify.    Past Psychiatric History:Reviewed. H/O irritability, anger, paranoia and trust issues since school age. One inpatient asteenager and thentwice in 1998. No h/o suicidal attempt. Tried lithium, Haldol, Risperdal and Paxil but no recall. H/Ousing drugs and alcohol.     Psychiatric Specialty Exam: Physical Exam  Review of Systems  There were no vitals taken for this visit.There is no height or weight on file to calculate BMI.  General Appearance: NA  Eye Contact:  NA  Speech:  Clear and Coherent and Normal Rate  Volume:  Normal  Mood:  Euthymic  Affect:  NA  Thought Process:  Goal Directed  Orientation:  Full (Time, Place, and  Person)  Thought Content:  WDL  Suicidal Thoughts:  No  Homicidal Thoughts:  No  Memory:  Immediate;   Good Recent;   Good Remote;   Good  Judgement:  Intact  Insight:  Present  Psychomotor Activity:  NA  Concentration:  Concentration: Fair and Attention Span: Fair  Recall:  Good  Fund of Knowledge:  Good  Language:  Good  Akathisia:  No  Handed:  Right  AIMS (if indicated):     Assets:  Communication Skills Desire for Improvement Housing Resilience  ADL's:  Intact  Cognition:  WNL  Sleep:   good      Assessment and Plan: Schizoaffective disorder, depressed type.  Patient is stable on his current medication.  Encouraged to continue walking and watch his calorie intake.  I will continue Abilify 10 mg daily.  He has no tremors, shakes or any EPS.  Recommended to call us back if is any question or any concern.  Follow-up in 3 months.  Follow Up Instructions:    I discussed the assessment and treatment plan with the patient. The patient was provided an opportunity to ask questions and all were answered. The patient agreed with the plan and demonstrated an understanding of the instructions.   The patient was advised to call back or seek an in-person evaluation if the symptoms worsen or if the condition fails to improve as anticipated.  I provided 20 minutes of non-face-to-face time during this encounter.  Kathlee Nations, MD

## 2019-06-15 DIAGNOSIS — M545 Low back pain: Secondary | ICD-10-CM | POA: Diagnosis not present

## 2019-06-19 DIAGNOSIS — M545 Low back pain: Secondary | ICD-10-CM | POA: Diagnosis not present

## 2019-06-22 DIAGNOSIS — M545 Low back pain: Secondary | ICD-10-CM | POA: Diagnosis not present

## 2019-06-26 DIAGNOSIS — M545 Low back pain: Secondary | ICD-10-CM | POA: Diagnosis not present

## 2019-06-29 DIAGNOSIS — M545 Low back pain: Secondary | ICD-10-CM | POA: Diagnosis not present

## 2019-07-03 DIAGNOSIS — M545 Low back pain: Secondary | ICD-10-CM | POA: Diagnosis not present

## 2019-07-04 DIAGNOSIS — G4733 Obstructive sleep apnea (adult) (pediatric): Secondary | ICD-10-CM | POA: Diagnosis not present

## 2019-07-05 DIAGNOSIS — M545 Low back pain: Secondary | ICD-10-CM | POA: Diagnosis not present

## 2019-07-10 DIAGNOSIS — M545 Low back pain: Secondary | ICD-10-CM | POA: Diagnosis not present

## 2019-07-12 DIAGNOSIS — M545 Low back pain: Secondary | ICD-10-CM | POA: Diagnosis not present

## 2019-07-16 DIAGNOSIS — E7800 Pure hypercholesterolemia, unspecified: Secondary | ICD-10-CM | POA: Diagnosis not present

## 2019-07-16 DIAGNOSIS — Z6834 Body mass index (BMI) 34.0-34.9, adult: Secondary | ICD-10-CM | POA: Diagnosis not present

## 2019-07-16 DIAGNOSIS — Z79899 Other long term (current) drug therapy: Secondary | ICD-10-CM | POA: Diagnosis not present

## 2019-07-16 DIAGNOSIS — G4730 Sleep apnea, unspecified: Secondary | ICD-10-CM | POA: Diagnosis not present

## 2019-07-16 DIAGNOSIS — R7303 Prediabetes: Secondary | ICD-10-CM | POA: Diagnosis not present

## 2019-07-16 DIAGNOSIS — I10 Essential (primary) hypertension: Secondary | ICD-10-CM | POA: Diagnosis not present

## 2019-07-20 DIAGNOSIS — M545 Low back pain: Secondary | ICD-10-CM | POA: Diagnosis not present

## 2019-07-24 DIAGNOSIS — G4733 Obstructive sleep apnea (adult) (pediatric): Secondary | ICD-10-CM | POA: Diagnosis not present

## 2019-07-24 DIAGNOSIS — M545 Low back pain: Secondary | ICD-10-CM | POA: Diagnosis not present

## 2019-07-30 DIAGNOSIS — M545 Low back pain: Secondary | ICD-10-CM | POA: Diagnosis not present

## 2019-07-31 DIAGNOSIS — B370 Candidal stomatitis: Secondary | ICD-10-CM | POA: Diagnosis not present

## 2019-07-31 DIAGNOSIS — L209 Atopic dermatitis, unspecified: Secondary | ICD-10-CM | POA: Diagnosis not present

## 2019-08-06 DIAGNOSIS — S39012A Strain of muscle, fascia and tendon of lower back, initial encounter: Secondary | ICD-10-CM | POA: Diagnosis not present

## 2019-08-06 DIAGNOSIS — I10 Essential (primary) hypertension: Secondary | ICD-10-CM | POA: Diagnosis not present

## 2019-08-13 DIAGNOSIS — M545 Low back pain: Secondary | ICD-10-CM | POA: Diagnosis not present

## 2019-08-21 DIAGNOSIS — K130 Diseases of lips: Secondary | ICD-10-CM | POA: Diagnosis not present

## 2019-08-21 DIAGNOSIS — R0982 Postnasal drip: Secondary | ICD-10-CM | POA: Diagnosis not present

## 2019-08-21 DIAGNOSIS — I10 Essential (primary) hypertension: Secondary | ICD-10-CM | POA: Diagnosis not present

## 2019-08-21 DIAGNOSIS — Z6835 Body mass index (BMI) 35.0-35.9, adult: Secondary | ICD-10-CM | POA: Diagnosis not present

## 2019-08-21 DIAGNOSIS — Z1331 Encounter for screening for depression: Secondary | ICD-10-CM | POA: Diagnosis not present

## 2019-08-21 DIAGNOSIS — G4730 Sleep apnea, unspecified: Secondary | ICD-10-CM | POA: Diagnosis not present

## 2019-08-21 DIAGNOSIS — J00 Acute nasopharyngitis [common cold]: Secondary | ICD-10-CM | POA: Diagnosis not present

## 2019-08-23 DIAGNOSIS — I10 Essential (primary) hypertension: Secondary | ICD-10-CM | POA: Diagnosis not present

## 2019-08-23 DIAGNOSIS — G4733 Obstructive sleep apnea (adult) (pediatric): Secondary | ICD-10-CM | POA: Diagnosis not present

## 2019-09-12 ENCOUNTER — Ambulatory Visit (INDEPENDENT_AMBULATORY_CARE_PROVIDER_SITE_OTHER): Payer: PPO | Admitting: Psychiatry

## 2019-09-12 ENCOUNTER — Other Ambulatory Visit: Payer: Self-pay

## 2019-09-12 ENCOUNTER — Encounter (HOSPITAL_COMMUNITY): Payer: Self-pay | Admitting: Psychiatry

## 2019-09-12 DIAGNOSIS — F251 Schizoaffective disorder, depressive type: Secondary | ICD-10-CM | POA: Diagnosis not present

## 2019-09-12 MED ORDER — ARIPIPRAZOLE 10 MG PO TABS: 10.0000 mg | ORAL_TABLET | Freq: Every day | ORAL | 0 refills | Status: DC

## 2019-09-12 NOTE — Progress Notes (Signed)
Virtual Visit via Telephone Note  I connected with Randall Mccann on 09/12/19 at  3:00 PM EDT by telephone and verified that I am speaking with the correct person using two identifiers.   I discussed the limitations, risks, security and privacy concerns of performing an evaluation and management service by telephone and the availability of in person appointments. I also discussed with the patient that there may be a patient responsible charge related to this service. The patient expressed understanding and agreed to proceed.   History of Present Illness: Patient was evaluated by phone session.  He is taking Abilify 10 mg and reported no side effects.  However he admitted lately more irritated and irritable because of his back pain.  He did physical therapy and try to do exercise and is stretching on a regular basis.  He understand that he need to go outside and socialize but due to back he has been more withdrawn.  He received the second dose of vaccine yesterday and is hoping that will give him a chance to go outside.  He is trying to lose weight and he lost another few pounds and is happy about it.  Does not want to change or adjust his medication but agreed that if he continued to have irritability and frustration that he will consider on the next appointment.  He has chronic trust issues.  He has no concern from the medication.  Recently his PCP adjusted his blood pressure medication and is hoping that that will also help his condition.  Past Psychiatric History:Reviewed. H/O irritability, anger, paranoia and trust issues since school age. One inpatient asteenager and thentwice in 1998. No h/o suicidal attempt. Tried lithium, Haldol, Risperdal and Paxil but no recall. H/Ousing drugs and alcohol.    Psychiatric Specialty Exam: Physical Exam  Review of Systems  Musculoskeletal: Positive for back pain.    There were no vitals taken for this visit.There is no height or weight on file  to calculate BMI.  General Appearance: NA  Eye Contact:  NA  Speech:  Normal Rate  Volume:  Normal  Mood:  Dysphoric and Irritable  Affect:  NA  Thought Process:  Goal Directed  Orientation:  Full (Time, Place, and Person)  Thought Content:  Rumination  Suicidal Thoughts:  No  Homicidal Thoughts:  No  Memory:  Immediate;   Good Recent;   Good Remote;   Good  Judgement:  Intact  Insight:  Present  Psychomotor Activity:  NA  Concentration:  Concentration: Fair and Attention Span: Fair  Recall:  Good  Fund of Knowledge:  Good  Language:  Good  Akathisia:  No  Handed:  Right  AIMS (if indicated):     Assets:  Communication Skills Desire for Improvement Housing Resilience  ADL's:  Intact  Cognition:  WNL  Sleep:   good      Assessment and Plan: Schizoaffective disorder, depressed type.  Patient noticed more irritable and frustrated because of chronic back pain.  He did physical therapy and now doing stretching and exercise and hoping that his condition resolved.  He does not want to change his medication at this time but willing to try on his next appointment if things he is doing did not help him.  I agree with the plan.  He will continue Abilify 10 mg daily.  I recommend to call us back if he has any question or any concern.  Follow-up in 3 months.  Follow Up Instructions:    I discussed  the assessment and treatment plan with the patient. The patient was provided an opportunity to ask questions and all were answered. The patient agreed with the plan and demonstrated an understanding of the instructions.   The patient was advised to call back or seek an in-person evaluation if the symptoms worsen or if the condition fails to improve as anticipated.  I provided 20 minutes of non-face-to-face time during this encounter.   Kathlee Nations, MD

## 2019-09-13 DIAGNOSIS — I10 Essential (primary) hypertension: Secondary | ICD-10-CM | POA: Diagnosis not present

## 2019-09-22 DIAGNOSIS — G4733 Obstructive sleep apnea (adult) (pediatric): Secondary | ICD-10-CM | POA: Diagnosis not present

## 2019-10-01 DIAGNOSIS — I10 Essential (primary) hypertension: Secondary | ICD-10-CM | POA: Diagnosis not present

## 2019-10-17 DIAGNOSIS — B370 Candidal stomatitis: Secondary | ICD-10-CM | POA: Diagnosis not present

## 2019-10-19 DIAGNOSIS — E119 Type 2 diabetes mellitus without complications: Secondary | ICD-10-CM | POA: Diagnosis not present

## 2019-10-19 DIAGNOSIS — H524 Presbyopia: Secondary | ICD-10-CM | POA: Diagnosis not present

## 2019-10-22 DIAGNOSIS — G4733 Obstructive sleep apnea (adult) (pediatric): Secondary | ICD-10-CM | POA: Diagnosis not present

## 2019-11-12 ENCOUNTER — Other Ambulatory Visit (HOSPITAL_COMMUNITY): Payer: Self-pay | Admitting: Psychiatry

## 2019-11-12 DIAGNOSIS — F251 Schizoaffective disorder, depressive type: Secondary | ICD-10-CM

## 2019-11-15 DIAGNOSIS — Z79899 Other long term (current) drug therapy: Secondary | ICD-10-CM | POA: Diagnosis not present

## 2019-11-15 DIAGNOSIS — M545 Low back pain: Secondary | ICD-10-CM | POA: Diagnosis not present

## 2019-11-21 DIAGNOSIS — G4733 Obstructive sleep apnea (adult) (pediatric): Secondary | ICD-10-CM | POA: Diagnosis not present

## 2019-11-27 DIAGNOSIS — G4733 Obstructive sleep apnea (adult) (pediatric): Secondary | ICD-10-CM | POA: Diagnosis not present

## 2019-11-27 DIAGNOSIS — G4712 Idiopathic hypersomnia without long sleep time: Secondary | ICD-10-CM | POA: Diagnosis not present

## 2019-11-28 DIAGNOSIS — L719 Rosacea, unspecified: Secondary | ICD-10-CM | POA: Diagnosis not present

## 2019-11-28 DIAGNOSIS — L918 Other hypertrophic disorders of the skin: Secondary | ICD-10-CM | POA: Diagnosis not present

## 2019-12-20 ENCOUNTER — Ambulatory Visit (HOSPITAL_COMMUNITY): Payer: PPO | Admitting: Psychiatry

## 2019-12-20 DIAGNOSIS — M48062 Spinal stenosis, lumbar region with neurogenic claudication: Secondary | ICD-10-CM | POA: Diagnosis not present

## 2019-12-20 DIAGNOSIS — M4726 Other spondylosis with radiculopathy, lumbar region: Secondary | ICD-10-CM | POA: Diagnosis not present

## 2019-12-20 DIAGNOSIS — M545 Low back pain: Secondary | ICD-10-CM | POA: Diagnosis not present

## 2019-12-21 DIAGNOSIS — G4733 Obstructive sleep apnea (adult) (pediatric): Secondary | ICD-10-CM | POA: Diagnosis not present

## 2019-12-27 ENCOUNTER — Encounter (HOSPITAL_COMMUNITY): Payer: Self-pay | Admitting: Psychiatry

## 2019-12-27 ENCOUNTER — Ambulatory Visit (INDEPENDENT_AMBULATORY_CARE_PROVIDER_SITE_OTHER): Payer: PPO | Admitting: Psychiatry

## 2019-12-27 ENCOUNTER — Other Ambulatory Visit: Payer: Self-pay

## 2019-12-27 DIAGNOSIS — F251 Schizoaffective disorder, depressive type: Secondary | ICD-10-CM

## 2019-12-27 MED ORDER — ARIPIPRAZOLE 10 MG PO TABS: 10.0000 mg | ORAL_TABLET | Freq: Every day | ORAL | 0 refills | Status: DC

## 2019-12-27 NOTE — Progress Notes (Signed)
Virtual Visit via Telephone Note  I connected with Randall Mccann on 12/27/19 at  2:20 PM EDT by telephone and verified that I am speaking with the correct person using two identifiers.  Location: Patient: home Provider: home office   I discussed the limitations, risks, security and privacy concerns of performing an evaluation and management service by telephone and the availability of in person appointments. I also discussed with the patient that there may be a patient responsible charge related to this service. The patient expressed understanding and agreed to proceed.   History of Present Illness: Patient is evaluated by phone session.  He is compliant with Abilify 10 mg.  He is somewhat upset because of restriction lifted but now he is seeing COVID cases increasing.  He has full vaccination.  He like to go outside but now he is not comfortable.  He has back pain and now he is seeing spine specialist and like to have MRI.  He has done PT but is still have back pain.  He also noticed sometimes short-term memory and upon questioning he do mention that family history of dementia.  He is trying to lose weight and so far he had lost few more pounds since the last visit.  He has irritability but denies any paranoia but admitted chronic trust issues and ruminative thoughts.  Denies any suicidal thoughts or homicidal thoughts.  He denies any mania or psychosis.  He does not want to change medication.  He sleeps most the time okay unless he has to wake up to go to the bathroom.    Past Psychiatric History:Reviewed. H/Oirritability, anger, paranoia and trust issues since school age. Oneinpatientasteenager and [phone]. No h/osuicidal attempt. Tried lithium, Haldol, Risperdal and Paxil butno recall.H/Ousing drugs and alcohol.   Psychiatric Specialty Exam: Physical Exam  Review of Systems  Musculoskeletal: Positive for back pain.    Weight (!) 226 lb (102.5 kg).There is  no height or weight on file to calculate BMI.  General Appearance: NA  Eye Contact:  NA  Speech:  Normal Rate  Volume:  Normal  Mood:  Anxious and Dysphoric  Affect:  NA  Thought Process:  Goal Directed  Orientation:  Full (Time, Place, and Person)  Thought Content:  Rumination and trust issues  Suicidal Thoughts:  No  Homicidal Thoughts:  No  Memory:  Immediate;   Good Recent;   Good Remote;   Good  Judgement:  Intact  Insight:  Present  Psychomotor Activity:  NA  Concentration:  Concentration: Fair and Attention Span: Fair  Recall:  Good  Fund of Knowledge:  Good  Language:  Good  Akathisia:  No  Handed:  Right  AIMS (if indicated):     Assets:  Communication Skills Desire for Improvement Housing Resilience  ADL's:  Intact  Cognition:  WNL  Sleep:   over all ok      Assessment and Plan: Schizoaffective disorder, depressed type.  Discuss concerned about his memory and family history of dementia.  Recommended to contact PCP to get a referral to see neurology for dementia work-up.  He does not want to change medication since it is working.  We will continue Abilify 10 mg daily.  So far he seems to be stable but realized if symptoms started to get worse then he will call us back.  Discussed medication side effects and benefits.  Follow-up in 3 months.  Follow Up Instructions:    I discussed the assessment and treatment plan with the patient.  The patient was provided an opportunity to ask questions and all were answered. The patient agreed with the plan and demonstrated an understanding of the instructions.   The patient was advised to call back or seek an in-person evaluation if the symptoms worsen or if the condition fails to improve as anticipated.  I provided 14 minutes of non-face-to-face time during this encounter.   Kathlee Nations, MD

## 2019-12-31 DIAGNOSIS — R413 Other amnesia: Secondary | ICD-10-CM | POA: Diagnosis not present

## 2020-01-01 DIAGNOSIS — M545 Low back pain: Secondary | ICD-10-CM | POA: Diagnosis not present

## 2020-01-03 ENCOUNTER — Encounter: Payer: Self-pay | Admitting: Psychology

## 2020-01-18 DIAGNOSIS — M48062 Spinal stenosis, lumbar region with neurogenic claudication: Secondary | ICD-10-CM | POA: Diagnosis not present

## 2020-01-18 DIAGNOSIS — M4726 Other spondylosis with radiculopathy, lumbar region: Secondary | ICD-10-CM | POA: Diagnosis not present

## 2020-01-18 DIAGNOSIS — I10 Essential (primary) hypertension: Secondary | ICD-10-CM | POA: Diagnosis not present

## 2020-01-18 DIAGNOSIS — M545 Low back pain: Secondary | ICD-10-CM | POA: Diagnosis not present

## 2020-01-21 DIAGNOSIS — G4733 Obstructive sleep apnea (adult) (pediatric): Secondary | ICD-10-CM | POA: Diagnosis not present

## 2020-02-07 DIAGNOSIS — M48062 Spinal stenosis, lumbar region with neurogenic claudication: Secondary | ICD-10-CM | POA: Diagnosis not present

## 2020-02-07 DIAGNOSIS — M545 Low back pain: Secondary | ICD-10-CM | POA: Diagnosis not present

## 2020-02-12 DIAGNOSIS — Z125 Encounter for screening for malignant neoplasm of prostate: Secondary | ICD-10-CM | POA: Diagnosis not present

## 2020-02-12 DIAGNOSIS — Z1329 Encounter for screening for other suspected endocrine disorder: Secondary | ICD-10-CM | POA: Diagnosis not present

## 2020-02-12 DIAGNOSIS — Z7189 Other specified counseling: Secondary | ICD-10-CM | POA: Diagnosis not present

## 2020-02-12 DIAGNOSIS — Z1339 Encounter for screening examination for other mental health and behavioral disorders: Secondary | ICD-10-CM | POA: Diagnosis not present

## 2020-02-12 DIAGNOSIS — Z0000 Encounter for general adult medical examination without abnormal findings: Secondary | ICD-10-CM | POA: Diagnosis not present

## 2020-02-14 ENCOUNTER — Other Ambulatory Visit: Payer: Self-pay

## 2020-02-14 ENCOUNTER — Encounter: Payer: Self-pay | Admitting: Psychology

## 2020-02-14 ENCOUNTER — Ambulatory Visit (INDEPENDENT_AMBULATORY_CARE_PROVIDER_SITE_OTHER): Payer: PPO | Admitting: Psychology

## 2020-02-14 ENCOUNTER — Ambulatory Visit: Payer: PPO | Admitting: Psychology

## 2020-02-14 DIAGNOSIS — G4711 Idiopathic hypersomnia with long sleep time: Secondary | ICD-10-CM

## 2020-02-14 DIAGNOSIS — F251 Schizoaffective disorder, depressive type: Secondary | ICD-10-CM | POA: Diagnosis not present

## 2020-02-14 DIAGNOSIS — G4733 Obstructive sleep apnea (adult) (pediatric): Secondary | ICD-10-CM | POA: Diagnosis not present

## 2020-02-14 DIAGNOSIS — E785 Hyperlipidemia, unspecified: Secondary | ICD-10-CM | POA: Insufficient documentation

## 2020-02-14 DIAGNOSIS — R4189 Other symptoms and signs involving cognitive functions and awareness: Secondary | ICD-10-CM | POA: Diagnosis not present

## 2020-02-14 NOTE — Progress Notes (Signed)
   Psychometrician Note   Cognitive testing was administered to Randall Mccann by Milana Kidney, B.S. (psychometrist) under the supervision of Dr. Christia Reading, Ph.D., licensed psychologist on 0916/2021. [name] did not appear overtly distressed by the testing session per behavioral observation or responses across self-report questionnaires. Dr. Christia Reading, Ph.D. checked in with [name] as needed to manage any distress related to testing procedures (if applicable). Rest breaks were offered.    The battery of tests administered was selected by Dr. Christia Reading, Ph.D. with consideration to [name] current level of functioning, the nature of his symptoms, emotional and behavioral responses during interview, level of literacy, observed level of motivation/effort, and the nature of the referral question. This battery was communicated to the psychometrist. Communication between Dr. Christia Reading, Ph.D. and the psychometrist was ongoing throughout the evaluation and Dr. Christia Reading, Ph.D. was immediately accessible at all times. Dr. Christia Reading, Ph.D. provided supervision to the psychometrist on the date of this service to the extent necessary to assure the quality of all services provided.    Randall Mccann will return within approximately 1-2 weeks for an interactive feedback session with Dr. Melvyn Novas at which time his test performances, clinical impressions, and treatment recommendations will be reviewed in detail. [name] understands he can contact our office should he require our assistance before this time.  A total of 155 minutes of billable time were spent face-to-face with [name] by the psychometrist. This includes both test administration and scoring time. Billing for these services is reflected in the clinical report generated by Dr. Christia Reading, Ph.D..  This note reflects time spent with the psychometrician and does not include test scores or  any clinical interpretations made by Dr. Melvyn Novas. The full report will follow in a separate note.

## 2020-02-14 NOTE — Progress Notes (Addendum)
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     Auditory Comprehension 54 66 Average    Naming 31/31 (54) 66 Average       Visuospatial/Visuoconstruction:      Raw Score Percentile   Clock Drawing: 9/10 --- Within Normal Limits       NAB Spatial Module, Form 1: T Score Percentile     Figure Drawing Copy 65 93 Well Above Average    Figure Drawing Immediate Recall 55 69 Average        Scaled Score Percentile   WAIS-IV Block Design: 8 25 Average  WAIS-IV Matrix Reasoning: 8 25 Average       Mood and Personality:      Raw Score Percentile   Beck Depression Inventory - II: 14 --- Mild  PROMIS Anxiety Questionnaire: 18 --- Mild       Additional Questionnaires:      Raw Score Percentile   PROMIS Sleep Disturbance Questionnaire: 18 --- None to Slight   Informed Consent and Coding/Compliance:   Mr. Fullen was provided with a verbal description of the nature and purpose of the present neuropsychological evaluation. Also reviewed were the foreseeable risks and/or discomforts and benefits of the procedure, limits of confidentiality, and mandatory reporting  requirements of this provider. The patient was given the opportunity to ask questions and receive answers about the evaluation. Oral consent to participate was provided by the patient.   This evaluation was conducted by Christia Reading, Ph.D., licensed clinical neuropsychologist. Mr. Duffus completed a comprehensive clinical  interview with Dr. Melvyn Novas, billed as one unit [phone], and 155 minutes of cognitive testing and scoring, billed as one unit [phone] and four additional units 96139. Psychometrist Milana Kidney, B.S., assisted Dr. Melvyn Novas with test administration and scoring procedures. As a separate and discrete service, Dr. Melvyn Novas spent a total of 160 minutes in interpretation and report writing billed as one unit [phone] and two units 96133.

## 2020-02-20 DIAGNOSIS — M48062 Spinal stenosis, lumbar region with neurogenic claudication: Secondary | ICD-10-CM | POA: Diagnosis not present

## 2020-02-20 DIAGNOSIS — G4733 Obstructive sleep apnea (adult) (pediatric): Secondary | ICD-10-CM | POA: Diagnosis not present

## 2020-02-21 ENCOUNTER — Ambulatory Visit (INDEPENDENT_AMBULATORY_CARE_PROVIDER_SITE_OTHER): Payer: PPO | Admitting: Psychology

## 2020-02-21 ENCOUNTER — Other Ambulatory Visit: Payer: Self-pay

## 2020-02-21 DIAGNOSIS — R4189 Other symptoms and signs involving cognitive functions and awareness: Secondary | ICD-10-CM

## 2020-02-21 DIAGNOSIS — F251 Schizoaffective disorder, depressive type: Secondary | ICD-10-CM

## 2020-02-21 NOTE — Patient Instructions (Signed)
Empirical research does suggest that individuals with a history of schizophrenia may be at an increased risk for the development of Alzheimer's disease. Should Randall Mccann experience cognitive or functional decline in the future, a repeat evaluation would be warranted at that time. The current evaluation will serve as an excellent baseline to compare future evaluations against.   A combination of medication and psychotherapy has been shown to be most effective at treating symptoms of anxiety and depression. As such, Randall Mccann is encouraged to speak with his prescribing physician regarding medication adjustments to optimally manage these symptoms as needed. Likewise, Randall Mccann is also could engage in short-term psychotherapy to address symptoms of psychiatric distress.  Randall Mccann is encouraged to attend to lifestyle factors for brain health (e.g., regular physical exercise, good nutrition habits, regular participation in cognitively-stimulating activities, and general stress management techniques), which are likely to have benefits for both emotional adjustment and cognition. In fact, in addition to promoting good general health, regular exercise incorporating aerobic activities (e.g., brisk walking, jogging, cycling, etc.) has been demonstrated to be a very effective treatment for depression and stress, with similar efficacy rates to both antidepressant medication and psychotherapy. Optimal control of vascular risk factors (including safe cardiovascular exercise and adherence to dietary recommendations) is encouraged. Likewise, continued compliance with his CPAP machine will also be important.  If interested, there are some activities which have therapeutic value and can be useful in keeping him cognitively stimulated. For suggestions, Randall Mccann is encouraged to go to the following website:  https://www.[facility].org/get-to-know-barrow/centers-programs/neurorehabilitation-center/neuro-rehab-apps-and-games/ which has options, categorized by level of difficulty. It should be noted that these activities should not be viewed as a substitute for therapy.  For day-to-day problems recalling information, he may benefit from using strategies to aid with his learning and memory, such as asking questions for clarification, requesting that information to be repeated, or repeating an explanation in his own words to ensure comprehension and promote encoding.    Memory can be improved using internal strategies such as rehearsal, repetition, chunking, mnemonics, association, and imagery. External strategies such as written notes in a consistently used memory journal, visual and nonverbal auditory cues such as a calendar on the refrigerator or appointments with alarm, such as on a cell phone, can also help maximize recall.    To address problems with executive dysfunction, he may wish to consider:   -Avoiding external distractions when needing to concentrate   -Limiting exposure to fast paced environments with multiple sensory demands   -Writing down complicated information and using checklists   -Attempting and completing one task at a time (i.e., no multi-tasking)   -Verbalizing aloud each step of a task to maintain focus   -Taking frequent breaks during the completion of steps/tasks to avoid fatigue   -Reducing the amount of information considered at one time   -Scheduling more difficult activities for a time of day where he is usually most alert

## 2020-02-21 NOTE — Progress Notes (Signed)
   Neuropsychology Feedback Session Randall Mccann. [facility] Department of Neurology  Reason for Referral:   Randall Mccann a 54 y.o. right-handed Caucasian male referred by Haydee Salter, NP,to characterize hiscurrent cognitive functioning and assist with diagnostic clarity and treatment planning in the context of subjective cognitive decline.   Feedback:   Randall Mccann completed a comprehensive neuropsychological evaluation on 02/14/2020. Please refer to that encounter for the full report and recommendations. Briefly, results suggested neuropsychological functioning largely within normal limits relative to age-matched peers. He did exhibit a relative weakness across executive functioning, especially working memory and response inhibition. However, performances across other aspects of executive functioning (cognitive flexibility, problem solving, adaptability/pattern recognition) were largely appropriate. The etiology for cognitive weaknesses and reported day-to-day dysfunction is likely multifactorial in nature. Randall Mccann has a history of schizoaffective disorder. Similar to other disorders on the schizophrenia spectrum, schizoaffective disorder can be directly associated with cognitive deficits and his primary weakness with certain aspects of executive functioning is certainly consistent with this presentation. Additionally, cognition can be disrupted by additional biopsychosocial factors, including mild levels of anxiety and depression, ongoing sleep dysfunction, chronic pain, and other personal stressors (e.g., family stress). It is possible that these additional variables exacerbate weaknesses present due to his underlying psychiatric history. Current testing does not raise concerns surrounding an early-onset neurodegenerative condition like Alzheimer's disease.   Randall Mccann was unaccompanied during the current telephone call. He was within his residence while I was within  my office. I discussed the limitations of evaluation and management by telemedicine and the availability of in person appointments. Randall Mccann expressed his understanding and agreed to proceed. Content of the current session focused on the results of his neuropsychological evaluation. Randall Mccann was given the opportunity to ask questions and his questions were answered. He was encouraged to reach out should additional questions arise. A copy of his report was mailed at the conclusion of the visit.      Less than 16 minutes were spent conducting the current feedback session with Randall Mccann.

## 2020-02-25 DIAGNOSIS — Z1159 Encounter for screening for other viral diseases: Secondary | ICD-10-CM | POA: Diagnosis not present

## 2020-02-28 DIAGNOSIS — Z800 Family history of malignant neoplasm of digestive organs: Secondary | ICD-10-CM | POA: Diagnosis not present

## 2020-02-28 DIAGNOSIS — K635 Polyp of colon: Secondary | ICD-10-CM | POA: Diagnosis not present

## 2020-02-29 ENCOUNTER — Telehealth: Payer: Self-pay | Admitting: Psychology

## 2020-02-29 NOTE — Telephone Encounter (Signed)
Patient has some questions about his test results and would like to speak to someone please call

## 2020-03-04 DIAGNOSIS — K635 Polyp of colon: Secondary | ICD-10-CM | POA: Diagnosis not present

## 2020-03-05 DIAGNOSIS — M4726 Other spondylosis with radiculopathy, lumbar region: Secondary | ICD-10-CM | POA: Diagnosis not present

## 2020-03-05 DIAGNOSIS — M48062 Spinal stenosis, lumbar region with neurogenic claudication: Secondary | ICD-10-CM | POA: Diagnosis not present

## 2020-03-21 DIAGNOSIS — G4733 Obstructive sleep apnea (adult) (pediatric): Secondary | ICD-10-CM | POA: Diagnosis not present

## 2020-03-26 ENCOUNTER — Telehealth (HOSPITAL_COMMUNITY): Payer: PPO | Admitting: Psychiatry

## 2020-03-27 ENCOUNTER — Telehealth (INDEPENDENT_AMBULATORY_CARE_PROVIDER_SITE_OTHER): Payer: PPO | Admitting: Psychiatry

## 2020-03-27 ENCOUNTER — Encounter (HOSPITAL_COMMUNITY): Payer: Self-pay | Admitting: Psychiatry

## 2020-03-27 ENCOUNTER — Other Ambulatory Visit: Payer: Self-pay

## 2020-03-27 DIAGNOSIS — F251 Schizoaffective disorder, depressive type: Secondary | ICD-10-CM | POA: Diagnosis not present

## 2020-03-27 MED ORDER — ARIPIPRAZOLE 10 MG PO TABS: 10.0000 mg | ORAL_TABLET | Freq: Every day | ORAL | 0 refills | Status: DC

## 2020-03-27 NOTE — Progress Notes (Signed)
Virtual Visit via Telephone Note  I connected with Randall Mccann on 03/27/20 at  2:40 PM EDT by telephone and verified that I am speaking with the correct person using two identifiers.  Location: Patient: Home Provider: Home Office   I discussed the limitations, risks, security and privacy concerns of performing an evaluation and management service by telephone and the availability of in person appointments. I also discussed with the patient that there may be a patient responsible charge related to this service. The patient expressed understanding and agreed to proceed.   History of Present Illness: Patient is evaluated by phone session.  He had a neuropsychological testing at neurology office and he was told that he have some deficiency in his memory.  Patient feels that making more anxious and nervous and now he wants some help that how he can improve his memory.  He also mentioned there a lot of things happen in past 2 months.  He has some financial crisis because his garbage disposal and fridge was not working.  He admitted getting easily irritable because of these issues and getting frustrated.  However things are better now.  He is sleeping okay.  Patient has a family history of dementia and he had noticed that sometimes he is forgetful.  We have recommended testing but patient has not had a chance to go over with psychologist who did the testing.  Patient denies any worsening of paranoia, hallucination, crying spells or any suicidal thoughts.  He is taking Abilify and his weight is stable.  He sleeps most of the time okay.  He admitted having trust issues but it is stable.  His back is much better since he received the injection.  Past Psychiatric History:Reviewed. H/Oirritability, anger, paranoia and trust issues since school age. [facility] and [phone]. No h/osuicidal attempt. Tried lithium, Haldol, Risperdal and Paxil butno recall.H/Ousing drugs  and alcohol.  Psychiatric Specialty Exam: Physical Exam  Review of Systems  Musculoskeletal: Positive for back pain.    Weight 226 lb (102.5 kg).There is no height or weight on file to calculate BMI.  General Appearance: NA  Eye Contact:  NA  Speech:  Slow  Volume:  Decreased  Mood:  Anxious  Affect:  NA  Thought Process:  Goal Directed  Orientation:  Full (Time, Place, and Person)  Thought Content:  Rumination and trust issues  Suicidal Thoughts:  No  Homicidal Thoughts:  No  Memory:  Immediate;   Good Recent;   Fair Remote;   Fair  Judgement:  Fair  Insight:  Shallow  Psychomotor Activity:  NA  Concentration:  Concentration: Fair and Attention Span: Fair  Recall:  Good  Fund of Knowledge:  Good  Language:  Good  Akathisia:  No  Handed:  Right  AIMS (if indicated):     Assets:  Communication Skills Desire for Improvement Housing  ADL's:  Intact  Cognition:  WNL  Sleep:   fair     Assessment and Plan: Schizoaffective disorder, depressed type  I reviewed notes from psychologist Dr. Alroy Dust who did psychological testing.  Patient has mild cognitive impairment.  He was told about cognitive exercise but patient like to discuss with the psychologist and go over with detail.  I recommended that he should make an appointment with a psychologist to discuss with the neuropsychological testing results.  He does not want to take any more medication and he feels Abilify 10 mg helping his paranoia and mood.  Patient is spoken to have therapy sessions  but apparently his psychologist is not in his network.  I offered to have therapy from our office which he accepted but patient will contact psychologist to go over with his neuropsychological testing.  I will continue Abilify 10 mg daily.  He like to have a follow-up in 3 months.  I recommend to call us back if is any question or any concern.  Follow-up in 3 months.    Follow Up Instructions:    I discussed the assessment and  treatment plan with the patient. The patient was provided an opportunity to ask questions and all were answered. The patient agreed with the plan and demonstrated an understanding of the instructions.   The patient was advised to call back or seek an in-person evaluation if the symptoms worsen or if the condition fails to improve as anticipated.  I provided 25 minutes of non-face-to-face time during this encounter.   Kathlee Nations, MD

## 2020-04-21 DIAGNOSIS — G4733 Obstructive sleep apnea (adult) (pediatric): Secondary | ICD-10-CM | POA: Diagnosis not present

## 2020-05-06 DIAGNOSIS — G4733 Obstructive sleep apnea (adult) (pediatric): Secondary | ICD-10-CM | POA: Diagnosis not present

## 2020-05-06 DIAGNOSIS — G4712 Idiopathic hypersomnia without long sleep time: Secondary | ICD-10-CM | POA: Diagnosis not present

## 2020-05-21 DIAGNOSIS — G4733 Obstructive sleep apnea (adult) (pediatric): Secondary | ICD-10-CM | POA: Diagnosis not present

## 2020-06-13 DIAGNOSIS — E7800 Pure hypercholesterolemia, unspecified: Secondary | ICD-10-CM | POA: Diagnosis not present

## 2020-06-13 DIAGNOSIS — K219 Gastro-esophageal reflux disease without esophagitis: Secondary | ICD-10-CM | POA: Diagnosis not present

## 2020-06-13 DIAGNOSIS — R7303 Prediabetes: Secondary | ICD-10-CM | POA: Diagnosis not present

## 2020-06-13 DIAGNOSIS — F251 Schizoaffective disorder, depressive type: Secondary | ICD-10-CM | POA: Diagnosis not present

## 2020-06-13 DIAGNOSIS — R21 Rash and other nonspecific skin eruption: Secondary | ICD-10-CM | POA: Diagnosis not present

## 2020-06-13 DIAGNOSIS — I10 Essential (primary) hypertension: Secondary | ICD-10-CM | POA: Diagnosis not present

## 2020-06-13 DIAGNOSIS — Z79899 Other long term (current) drug therapy: Secondary | ICD-10-CM | POA: Diagnosis not present

## 2020-06-13 DIAGNOSIS — Z6835 Body mass index (BMI) 35.0-35.9, adult: Secondary | ICD-10-CM | POA: Diagnosis not present

## 2020-06-18 DIAGNOSIS — I10 Essential (primary) hypertension: Secondary | ICD-10-CM | POA: Diagnosis not present

## 2020-06-18 DIAGNOSIS — S0990XA Unspecified injury of head, initial encounter: Secondary | ICD-10-CM | POA: Diagnosis not present

## 2020-06-18 DIAGNOSIS — W009XXA Unspecified fall due to ice and snow, initial encounter: Secondary | ICD-10-CM | POA: Diagnosis not present

## 2020-06-18 DIAGNOSIS — E1169 Type 2 diabetes mellitus with other specified complication: Secondary | ICD-10-CM | POA: Diagnosis not present

## 2020-06-18 DIAGNOSIS — E7800 Pure hypercholesterolemia, unspecified: Secondary | ICD-10-CM | POA: Diagnosis not present

## 2020-06-19 DIAGNOSIS — S0990XA Unspecified injury of head, initial encounter: Secondary | ICD-10-CM | POA: Diagnosis not present

## 2020-06-19 DIAGNOSIS — R519 Headache, unspecified: Secondary | ICD-10-CM | POA: Diagnosis not present

## 2020-06-20 DIAGNOSIS — G4733 Obstructive sleep apnea (adult) (pediatric): Secondary | ICD-10-CM | POA: Diagnosis not present

## 2020-06-23 DIAGNOSIS — J019 Acute sinusitis, unspecified: Secondary | ICD-10-CM | POA: Diagnosis not present

## 2020-06-23 DIAGNOSIS — Z20822 Contact with and (suspected) exposure to covid-19: Secondary | ICD-10-CM | POA: Diagnosis not present

## 2020-06-24 ENCOUNTER — Encounter (HOSPITAL_COMMUNITY): Payer: Self-pay | Admitting: Psychiatry

## 2020-06-24 ENCOUNTER — Telehealth (INDEPENDENT_AMBULATORY_CARE_PROVIDER_SITE_OTHER): Payer: PPO | Admitting: Psychiatry

## 2020-06-24 ENCOUNTER — Other Ambulatory Visit: Payer: Self-pay

## 2020-06-24 DIAGNOSIS — F251 Schizoaffective disorder, depressive type: Secondary | ICD-10-CM | POA: Diagnosis not present

## 2020-06-24 MED ORDER — ARIPIPRAZOLE 10 MG PO TABS: 10.0000 mg | ORAL_TABLET | Freq: Every day | ORAL | 0 refills | Status: DC

## 2020-06-24 NOTE — Progress Notes (Signed)
Virtual Visit via Telephone Note  I connected with Randall Mccann on 06/24/20 at 10:40 AM EST by telephone and verified that I am speaking with the correct person using two identifiers.  Location: Patient: Home Provider: Home Office   I discussed the limitations, risks, security and privacy concerns of performing an evaluation and management service by telephone and the availability of in person appointments. I also discussed with the patient that there may be a patient responsible charge related to this service. The patient expressed understanding and agreed to proceed.   History of Present Illness: Patient is a limited by phone session.  He is on the phone by himself.  Patient told last week was a very rough week because a lot of things happen.  He slipped on ice and then he also hurt his back.  Now he noticed that may he may have COVID symptoms but he is not sure because it could be sinusitis.  He was also diagnosed with diabetes and his hemoglobin A1c is 6.9.  He is seeing physician at [facility] by Dena Billet.  However he feels his paranoia and anxiety is a stable.  He had a quite Christmas.  He apologized not able to connect with a psychologist to review his cognitive ex size.  He still sometimes gets distracted and attention concentration is good especially short-term memory.  He still have trust issues but he feels his stable.  He admitted getting irritable but does not want to add or cut down his medication.  He started on Ozempic but he has not picked up yet and hoping to start very soon.  He denies any crying spells, suicidal thoughts or homicidal thoughts.  He has back pain and sometimes that limits his mobility.  Past Psychiatric History:Reviewed. H/Oirritability, anger, paranoia and trust issues since school age. [facility] and [phone]. No h/osuicidal attempt. Tried lithium, Haldol, Risperdal and Paxil butno recall.H/Ousing  drugs and alcohol.   Psychiatric Specialty Exam: Physical Exam  Review of Systems  Weight 235 lb (106.6 kg).There is no height or weight on file to calculate BMI.  General Appearance: NA  Eye Contact:  NA  Speech:  Slow  Volume:  Decreased  Mood:  Dysphoric  Affect:  NA  Thought Process:  Descriptions of Associations: Intact  Orientation:  Full (Time, Place, and Person)  Thought Content:  Rumination  Suicidal Thoughts:  No  Homicidal Thoughts:  No  Memory:  Immediate;   Good Recent;   Fair Remote;   Fair  Judgement:  Intact  Insight:  Present  Psychomotor Activity:  NA  Concentration:  Concentration: Fair and Attention Span: Fair  Recall:  [employer] of Knowledge:  Good  Language:  Good  Akathisia:  No  Handed:  Right  AIMS (if indicated):     Assets:  Communication Skills Desire for Improvement Housing Resilience  ADL's:  Intact  Cognition:  WNL  Sleep:         Assessment and Plan: Schizoaffective disorder, depressed type.  Patient has now diabetes and he is going to start [place].  His blood sugar is high and he was told hemoglobin A1c 6.9.  We will try to get the records from his PCP Dr. Ivin Booty at [facility].  Their number is [phone].  We talked about cutting down the Abilify the patient is very reluctant and hesitant to cut down the dose because he feels Abilify is working.  We talked about helping his blood sugar  under control may help his attention concentration and weight.  Also encouraged that he should contact her psychologist to go over with cognitive exercise and he promised to do that.  Discussed medication side effects and benefits.  He is not interested in therapy.  Continue Abilify 10 mg daily.  He like to follow-up in 3 months.  Follow Up Instructions:    I discussed the assessment and treatment plan with the patient. The patient was provided an opportunity to ask questions and all were answered. The patient agreed with the plan  and demonstrated an understanding of the instructions.   The patient was advised to call back or seek an in-person evaluation if the symptoms worsen or if the condition fails to improve as anticipated.  I provided 19 minutes of non-face-to-face time during this encounter.   Kathlee Nations, MD

## 2020-07-14 ENCOUNTER — Telehealth (HOSPITAL_COMMUNITY): Payer: Self-pay | Admitting: *Deleted

## 2020-07-14 NOTE — Telephone Encounter (Signed)
Can you make a copy and than scan. Usually scan center take a long time.

## 2020-07-14 NOTE — Telephone Encounter (Signed)
Labs from 06/13/20 received from [facility]. To be scanned to chart.

## 2020-07-16 DIAGNOSIS — E1169 Type 2 diabetes mellitus with other specified complication: Secondary | ICD-10-CM | POA: Diagnosis not present

## 2020-07-16 DIAGNOSIS — G4733 Obstructive sleep apnea (adult) (pediatric): Secondary | ICD-10-CM | POA: Diagnosis not present

## 2020-08-12 DIAGNOSIS — G4733 Obstructive sleep apnea (adult) (pediatric): Secondary | ICD-10-CM | POA: Diagnosis not present

## 2020-08-13 DIAGNOSIS — L300 Nummular dermatitis: Secondary | ICD-10-CM | POA: Diagnosis not present

## 2020-08-14 ENCOUNTER — Other Ambulatory Visit (HOSPITAL_COMMUNITY): Payer: Self-pay | Admitting: Psychiatry

## 2020-08-14 DIAGNOSIS — F251 Schizoaffective disorder, depressive type: Secondary | ICD-10-CM

## 2020-09-02 DIAGNOSIS — M48062 Spinal stenosis, lumbar region with neurogenic claudication: Secondary | ICD-10-CM | POA: Diagnosis not present

## 2020-09-02 DIAGNOSIS — M4726 Other spondylosis with radiculopathy, lumbar region: Secondary | ICD-10-CM | POA: Diagnosis not present

## 2020-09-11 DIAGNOSIS — G4733 Obstructive sleep apnea (adult) (pediatric): Secondary | ICD-10-CM | POA: Diagnosis not present

## 2020-09-16 DIAGNOSIS — M48062 Spinal stenosis, lumbar region with neurogenic claudication: Secondary | ICD-10-CM | POA: Diagnosis not present

## 2020-09-17 ENCOUNTER — Telehealth (INDEPENDENT_AMBULATORY_CARE_PROVIDER_SITE_OTHER): Payer: PPO | Admitting: Psychiatry

## 2020-09-17 ENCOUNTER — Encounter (HOSPITAL_COMMUNITY): Payer: Self-pay | Admitting: Psychiatry

## 2020-09-17 ENCOUNTER — Other Ambulatory Visit: Payer: Self-pay

## 2020-09-17 VITALS — Wt 221.0 lb

## 2020-09-17 DIAGNOSIS — F251 Schizoaffective disorder, depressive type: Secondary | ICD-10-CM

## 2020-09-17 DIAGNOSIS — F419 Anxiety disorder, unspecified: Secondary | ICD-10-CM | POA: Diagnosis not present

## 2020-09-17 MED ORDER — ARIPIPRAZOLE 10 MG PO TABS: 10.0000 mg | ORAL_TABLET | Freq: Every day | ORAL | 0 refills | Status: DC

## 2020-09-17 NOTE — Progress Notes (Signed)
Virtual Visit via Telephone Note  I connected with Randall Mccann on 09/17/20 at  3:40 PM EDT by telephone and verified that I am speaking with the correct person using two identifiers.  Location: Patient: home Provider: home office   I discussed the limitations, risks, security and privacy concerns of performing an evaluation and management service by telephone and the availability of in person appointments. I also discussed with the patient that there may be a patient responsible charge related to this service. The patient expressed understanding and agreed to proceed.   History of Present Illness: Patient is evaluated by phone session.  Yesterday he received the shot in his back and hoping it will help his back pain.  Today he is little bit drowsy but able to comprehend.  Patient told he is taking his medication and he seems it is helping his anxiety and paranoia.  He is also checking his blood sugar twice a day and he had lost almost 15 pounds since he started Ozempic.  We received the blood work from his physician.  His hemoglobin A1c is 6.9.  He has coming up another blood work soon.  Today patient is mostly concerned about his physical health, diabetes, back pain.  He wants to start working but not sure because of his physical symptoms.  He reported financial strain as he is not working.  He is sleeping better.  Sometimes he gets irritable and gets frustrated but denies any hallucination, suicidal thoughts or homicidal thoughts.  He has trust issue.  He forgot to follow up with his psychologist to work on his cognitive exercise.  He like to contact with him so he can address his attention and focus.  He does not want to change the medication Abilify since he feels it is helping his paranoia, anxiety and depression.  Past Psychiatric History:Reviewed. H/Oirritability, anger, paranoia and trust issues since school age. [facility] and [phone]. No h/osuicidal  attempt. Tried lithium, Haldol, Risperdal and Paxil butno recall.H/Ousing drugs and alcohol.   Psychiatric Specialty Exam: Physical Exam  Review of Systems  Weight 221 lb (100.2 kg).There is no height or weight on file to calculate BMI.  General Appearance: NA  Eye Contact:  NA  Speech:  Slow  Volume:  Decreased  Mood:  Anxious  Affect:  NA  Thought Process:  Descriptions of Associations: Intact  Orientation:  Full (Time, Place, and Person)  Thought Content:  Paranoid Ideation and Rumination  Suicidal Thoughts:  No  Homicidal Thoughts:  No  Memory:  Immediate;   Good Recent;   Fair Remote;   Fair  Judgement:  Fair  Insight:  Shallow  Psychomotor Activity:  NA  Concentration:  Concentration: Fair and Attention Span: Fair  Recall:  [employer] of Knowledge:  Fair  Language:  Good  Akathisia:  No  Handed:  Right  AIMS (if indicated):     Assets:  Communication Skills Desire for Improvement Housing Resilience  ADL's:  Intact  Cognition:  WNL  Sleep:   fair      Assessment and Plan: Schizoaffective disorder, depressed type.  Anxiety.  Patient is stable on his current medication.  His blood sugar as per him much better and he had lost weight from the past.  I encouraged to keep appointment with his PCP Dr. Ivin Booty at [facility] in [place].  I will continue Abilify 10 mg daily.  Encourage healthy lifestyle and watch his calorie intake.  Patient will contact his psychologist to address his  cognitive exercise.  Recommended to call us back with any question or any concern.  Follow-up in 3 months.  Follow Up Instructions:    I discussed the assessment and treatment plan with the patient. The patient was provided an opportunity to ask questions and all were answered. The patient agreed with the plan and demonstrated an understanding of the instructions.   The patient was advised to call back or seek an in-person evaluation if the symptoms worsen or if the condition  fails to improve as anticipated.  I provided 17 minutes of non-face-to-face time during this encounter.   Kathlee Nations, MD

## 2020-09-18 DIAGNOSIS — E7800 Pure hypercholesterolemia, unspecified: Secondary | ICD-10-CM | POA: Diagnosis not present

## 2020-09-18 DIAGNOSIS — Z6831 Body mass index (BMI) 31.0-31.9, adult: Secondary | ICD-10-CM | POA: Diagnosis not present

## 2020-09-18 DIAGNOSIS — I781 Nevus, non-neoplastic: Secondary | ICD-10-CM | POA: Diagnosis not present

## 2020-09-18 DIAGNOSIS — I10 Essential (primary) hypertension: Secondary | ICD-10-CM | POA: Diagnosis not present

## 2020-09-18 DIAGNOSIS — Z79899 Other long term (current) drug therapy: Secondary | ICD-10-CM | POA: Diagnosis not present

## 2020-09-18 DIAGNOSIS — E1169 Type 2 diabetes mellitus with other specified complication: Secondary | ICD-10-CM | POA: Diagnosis not present

## 2020-09-19 DIAGNOSIS — E1169 Type 2 diabetes mellitus with other specified complication: Secondary | ICD-10-CM | POA: Diagnosis not present

## 2020-09-19 DIAGNOSIS — Z79899 Other long term (current) drug therapy: Secondary | ICD-10-CM | POA: Diagnosis not present

## 2020-09-19 DIAGNOSIS — E7800 Pure hypercholesterolemia, unspecified: Secondary | ICD-10-CM | POA: Diagnosis not present

## 2020-09-22 ENCOUNTER — Telehealth (HOSPITAL_COMMUNITY): Payer: Self-pay | Admitting: *Deleted

## 2020-09-22 NOTE — Telephone Encounter (Signed)
Labs results received form [facility]. All WNL with exception of Triglycerides @ 180 and HDL low @ 34. Will put in your box then will be scanned to chart.

## 2020-10-06 DIAGNOSIS — R7989 Other specified abnormal findings of blood chemistry: Secondary | ICD-10-CM | POA: Diagnosis not present

## 2020-10-13 DIAGNOSIS — G4733 Obstructive sleep apnea (adult) (pediatric): Secondary | ICD-10-CM | POA: Diagnosis not present

## 2020-10-15 DIAGNOSIS — M4726 Other spondylosis with radiculopathy, lumbar region: Secondary | ICD-10-CM | POA: Diagnosis not present

## 2020-10-15 DIAGNOSIS — M48062 Spinal stenosis, lumbar region with neurogenic claudication: Secondary | ICD-10-CM | POA: Diagnosis not present

## 2020-10-21 DIAGNOSIS — R7989 Other specified abnormal findings of blood chemistry: Secondary | ICD-10-CM | POA: Diagnosis not present

## 2020-11-10 DIAGNOSIS — G4733 Obstructive sleep apnea (adult) (pediatric): Secondary | ICD-10-CM | POA: Diagnosis not present

## 2020-11-10 DIAGNOSIS — G4712 Idiopathic hypersomnia without long sleep time: Secondary | ICD-10-CM | POA: Diagnosis not present

## 2020-11-11 ENCOUNTER — Other Ambulatory Visit (HOSPITAL_COMMUNITY): Payer: Self-pay | Admitting: Psychiatry

## 2020-11-11 DIAGNOSIS — F251 Schizoaffective disorder, depressive type: Secondary | ICD-10-CM

## 2020-11-11 DIAGNOSIS — F419 Anxiety disorder, unspecified: Secondary | ICD-10-CM

## 2020-11-12 DIAGNOSIS — G4733 Obstructive sleep apnea (adult) (pediatric): Secondary | ICD-10-CM | POA: Diagnosis not present

## 2020-11-17 ENCOUNTER — Other Ambulatory Visit (HOSPITAL_COMMUNITY): Payer: Self-pay | Admitting: Psychiatry

## 2020-11-17 DIAGNOSIS — F251 Schizoaffective disorder, depressive type: Secondary | ICD-10-CM

## 2020-11-17 DIAGNOSIS — F419 Anxiety disorder, unspecified: Secondary | ICD-10-CM

## 2020-12-11 DIAGNOSIS — I10 Essential (primary) hypertension: Secondary | ICD-10-CM | POA: Diagnosis not present

## 2020-12-11 DIAGNOSIS — Z6828 Body mass index (BMI) 28.0-28.9, adult: Secondary | ICD-10-CM | POA: Diagnosis not present

## 2020-12-11 DIAGNOSIS — E7800 Pure hypercholesterolemia, unspecified: Secondary | ICD-10-CM | POA: Diagnosis not present

## 2020-12-11 DIAGNOSIS — E1169 Type 2 diabetes mellitus with other specified complication: Secondary | ICD-10-CM | POA: Diagnosis not present

## 2020-12-12 DIAGNOSIS — E1169 Type 2 diabetes mellitus with other specified complication: Secondary | ICD-10-CM | POA: Diagnosis not present

## 2020-12-12 DIAGNOSIS — E7800 Pure hypercholesterolemia, unspecified: Secondary | ICD-10-CM | POA: Diagnosis not present

## 2020-12-12 DIAGNOSIS — G4733 Obstructive sleep apnea (adult) (pediatric): Secondary | ICD-10-CM | POA: Diagnosis not present

## 2020-12-12 DIAGNOSIS — Z79899 Other long term (current) drug therapy: Secondary | ICD-10-CM | POA: Diagnosis not present

## 2020-12-18 ENCOUNTER — Encounter (HOSPITAL_COMMUNITY): Payer: Self-pay | Admitting: Psychiatry

## 2020-12-18 ENCOUNTER — Telehealth (INDEPENDENT_AMBULATORY_CARE_PROVIDER_SITE_OTHER): Payer: PPO | Admitting: Psychiatry

## 2020-12-18 ENCOUNTER — Other Ambulatory Visit: Payer: Self-pay

## 2020-12-18 DIAGNOSIS — F419 Anxiety disorder, unspecified: Secondary | ICD-10-CM | POA: Diagnosis not present

## 2020-12-18 DIAGNOSIS — F251 Schizoaffective disorder, depressive type: Secondary | ICD-10-CM | POA: Diagnosis not present

## 2020-12-18 MED ORDER — ARIPIPRAZOLE 10 MG PO TABS: 10.0000 mg | ORAL_TABLET | Freq: Every day | ORAL | 0 refills | Status: DC

## 2020-12-18 NOTE — Progress Notes (Signed)
Virtual Visit via Telephone Note  I connected with Randall Mccann on 12/18/20 at  3:00 PM EDT by telephone and verified that I am speaking with the correct person using two identifiers.  Location: Patient: Home Provider: Office   I discussed the limitations, risks, security and privacy concerns of performing an evaluation and management service by telephone and the availability of in person appointments. I also discussed with the patient that there may be a patient responsible charge related to this service. The patient expressed understanding and agreed to proceed.   History of Present Illness: Patient is evaluated by phone session.  He is doing much better today because he got good news that he had lost another 25 pounds and his hemoglobin A1c dropped to 5.2.  He is taking Ozempic which had helped him and he feels more relaxed, because he was very concerned about his weight and high blood sugar.  He is sleeping okay.  He feels his paranoia and thinking is clear and denies any anger, frustration, hallucination or any suicidal thoughts.  He has no tremors or shakes.  Does not want to change the medication since he feels Abilify helping him.  Denies any tremors shakes or any EPS.   Past Psychiatric History: Reviewed. H/O irritability, anger, paranoia and trust issues since school age.  One inpatient as teenager and then twice in 1998.  No h/o suicidal attempt.  Tried lithium, Haldol, Risperdal and Paxil but no recall. H/O using drugs and alcohol.     Psychiatric Specialty Exam: Physical Exam  Review of Systems  Weight 189 lb (85.7 kg).There is no height or weight on file to calculate BMI.  General Appearance: NA  Eye Contact:  NA  Speech:  Clear and Coherent  Volume:  Normal  Mood:  Euthymic  Affect:  NA  Thought Process:  Goal Directed  Orientation:  Full (Time, Place, and Person)  Thought Content:  WDL  Suicidal Thoughts:  No  Homicidal Thoughts:  No  Memory:  Immediate;    Good Recent;   Good Remote;   Good  Judgement:  Intact  Insight:  Present  Psychomotor Activity:  NA  Concentration:  Concentration: Good and Attention Span: Good  Recall:  Good  Fund of Knowledge:  Good  Language:  Good  Akathisia:  No  Handed:  Right  AIMS (if indicated):     Assets:  Communication Skills Desire for Improvement Housing Resilience  ADL's:  Intact  Cognition:  WNL  Sleep:   ok      Assessment and Plan: Schizo affective disorder, depressed type.  Anxiety.  Patient is stable on his current medication.  He had lost another 25 pounds since he started Ozempic by his PCP.  He feels less anxious.  He does not want to change the medication.  His last hemoglobin A1c was 5.2 which was done recently.  I will continue Abilify 10 mg daily.  Recommended to call us back if is any question of any concern.  Follow-up in 3 months.  Follow Up Instructions:    I discussed the assessment and treatment plan with the patient. The patient was provided an opportunity to ask questions and all were answered. The patient agreed with the plan and demonstrated an understanding of the instructions.   The patient was advised to call back or seek an in-person evaluation if the symptoms worsen or if the condition fails to improve as anticipated.  I provided 16 minutes of non-face-to-face time during this encounter.  Kathlee Nations, MD

## 2021-01-12 DIAGNOSIS — G4733 Obstructive sleep apnea (adult) (pediatric): Secondary | ICD-10-CM | POA: Diagnosis not present

## 2021-01-14 DIAGNOSIS — Z6827 Body mass index (BMI) 27.0-27.9, adult: Secondary | ICD-10-CM | POA: Diagnosis not present

## 2021-01-14 DIAGNOSIS — R197 Diarrhea, unspecified: Secondary | ICD-10-CM | POA: Diagnosis not present

## 2021-01-19 DIAGNOSIS — K219 Gastro-esophageal reflux disease without esophagitis: Secondary | ICD-10-CM | POA: Diagnosis not present

## 2021-01-19 DIAGNOSIS — R197 Diarrhea, unspecified: Secondary | ICD-10-CM | POA: Diagnosis not present

## 2021-01-21 DIAGNOSIS — R197 Diarrhea, unspecified: Secondary | ICD-10-CM | POA: Diagnosis not present

## 2021-01-27 ENCOUNTER — Other Ambulatory Visit: Payer: Self-pay | Admitting: Gastroenterology

## 2021-01-27 ENCOUNTER — Ambulatory Visit
Admission: RE | Admit: 2021-01-27 | Discharge: 2021-01-27 | Disposition: A | Discharge status: Home / Self Care | Payer: PPO | Source: Ambulatory Visit | Attending: Gastroenterology | Admitting: Gastroenterology

## 2021-01-27 ENCOUNTER — Other Ambulatory Visit: Payer: Self-pay

## 2021-01-27 DIAGNOSIS — R197 Diarrhea, unspecified: Secondary | ICD-10-CM

## 2021-01-27 DIAGNOSIS — R112 Nausea with vomiting, unspecified: Secondary | ICD-10-CM | POA: Diagnosis not present

## 2021-02-02 DIAGNOSIS — M542 Cervicalgia: Secondary | ICD-10-CM | POA: Diagnosis not present

## 2021-02-04 DIAGNOSIS — M5410 Radiculopathy, site unspecified: Secondary | ICD-10-CM | POA: Diagnosis not present

## 2021-02-04 DIAGNOSIS — Z6828 Body mass index (BMI) 28.0-28.9, adult: Secondary | ICD-10-CM | POA: Diagnosis not present

## 2021-02-04 DIAGNOSIS — M542 Cervicalgia: Secondary | ICD-10-CM | POA: Diagnosis not present

## 2021-02-08 ENCOUNTER — Other Ambulatory Visit (HOSPITAL_COMMUNITY): Payer: Self-pay | Admitting: Psychiatry

## 2021-02-08 DIAGNOSIS — F251 Schizoaffective disorder, depressive type: Secondary | ICD-10-CM

## 2021-02-08 DIAGNOSIS — F419 Anxiety disorder, unspecified: Secondary | ICD-10-CM

## 2021-02-11 DIAGNOSIS — G4733 Obstructive sleep apnea (adult) (pediatric): Secondary | ICD-10-CM | POA: Diagnosis not present

## 2021-02-18 DIAGNOSIS — Z1339 Encounter for screening examination for other mental health and behavioral disorders: Secondary | ICD-10-CM | POA: Diagnosis not present

## 2021-02-18 DIAGNOSIS — Z0001 Encounter for general adult medical examination with abnormal findings: Secondary | ICD-10-CM | POA: Diagnosis not present

## 2021-02-18 DIAGNOSIS — Z7189 Other specified counseling: Secondary | ICD-10-CM | POA: Diagnosis not present

## 2021-02-18 DIAGNOSIS — Z1331 Encounter for screening for depression: Secondary | ICD-10-CM | POA: Diagnosis not present

## 2021-02-18 DIAGNOSIS — E1169 Type 2 diabetes mellitus with other specified complication: Secondary | ICD-10-CM | POA: Diagnosis not present

## 2021-02-23 DIAGNOSIS — M5412 Radiculopathy, cervical region: Secondary | ICD-10-CM | POA: Diagnosis not present

## 2021-02-23 DIAGNOSIS — Z6834 Body mass index (BMI) 34.0-34.9, adult: Secondary | ICD-10-CM | POA: Diagnosis not present

## 2021-02-23 DIAGNOSIS — M48062 Spinal stenosis, lumbar region with neurogenic claudication: Secondary | ICD-10-CM | POA: Diagnosis not present

## 2021-02-23 DIAGNOSIS — M542 Cervicalgia: Secondary | ICD-10-CM | POA: Diagnosis not present

## 2021-03-13 DIAGNOSIS — G4733 Obstructive sleep apnea (adult) (pediatric): Secondary | ICD-10-CM | POA: Diagnosis not present

## 2021-03-17 ENCOUNTER — Telehealth (HOSPITAL_COMMUNITY): Payer: PPO | Admitting: Psychiatry

## 2021-03-18 ENCOUNTER — Telehealth (HOSPITAL_BASED_OUTPATIENT_CLINIC_OR_DEPARTMENT_OTHER): Payer: PPO | Admitting: Psychiatry

## 2021-03-18 ENCOUNTER — Encounter (HOSPITAL_COMMUNITY): Payer: Self-pay | Admitting: Psychiatry

## 2021-03-18 ENCOUNTER — Other Ambulatory Visit: Payer: Self-pay

## 2021-03-18 DIAGNOSIS — F419 Anxiety disorder, unspecified: Secondary | ICD-10-CM

## 2021-03-18 DIAGNOSIS — M5412 Radiculopathy, cervical region: Secondary | ICD-10-CM | POA: Diagnosis not present

## 2021-03-18 DIAGNOSIS — M48062 Spinal stenosis, lumbar region with neurogenic claudication: Secondary | ICD-10-CM | POA: Diagnosis not present

## 2021-03-18 DIAGNOSIS — F251 Schizoaffective disorder, depressive type: Secondary | ICD-10-CM

## 2021-03-18 MED ORDER — ARIPIPRAZOLE 10 MG PO TABS: 10.0000 mg | ORAL_TABLET | Freq: Every day | ORAL | 0 refills | Status: DC

## 2021-03-18 NOTE — Progress Notes (Addendum)
Virtual Visit via Telephone Note  I connected with Randall Mccann on 03/18/21 at  2:00 PM EDT by telephone and verified that I am speaking with the correct person using two identifiers.  Location: Patient: In Car Provider: Home office   I discussed the limitations, risks, security and privacy concerns of performing an evaluation and management service by telephone and the availability of in person appointments. I also discussed with the patient that there may be a patient responsible charge related to this service. The patient expressed understanding and agreed to proceed.   History of Present Illness: Patient is evaluated by phone session.  He is taking Abilify 10 mg and he noticed that is helping the paranoia, mood swing, anger or irritability.  However he is anxious about his general physical health.  His neck pain is bothering him a lot and today he is coming from doctor's appointment.  He was given a steroid which helped but after stopping this to fight his symptoms coming back.  His physician started him on gabapentin and he noticed some improvement but sometimes will feel groggy in the morning.  He denies any panic attack, crying spells, feeling of hopelessness.  He is no longer on Ozempic as his physician wants him to hold Ozempic for now.  But he is pleased that his weight remains stable.  He has no tremors, shakes or any EPS.  He is seeing physician at [facility] and taking his medicine for blood pressure and high cholesterol.  He is also taking clonidine prescribed for hypertension.  Patient has no tremor or shakes or any EPS.  He like to continue Abilify 10 mg daily.  Past Psychiatric History: Reviewed. H/O irritability, anger, paranoia and trust issues since school age.  One inpatient as teenager and then twice in 1998.  No h/o suicidal attempt.  Tried lithium, Haldol, Risperdal and Paxil but no recall. H/O using drugs and alcohol.    Psychiatric Specialty Exam: Physical  Exam  Review of Systems  Weight 189 lb (85.7 kg).There is no height or weight on file to calculate BMI.  General Appearance: NA  Eye Contact:  NA  Speech:  Normal Rate  Volume:  Normal  Mood:  Anxious  Affect:  NA  Thought Process:  Goal Directed  Orientation:  Full (Time, Place, and Person)  Thought Content:  WDL  Suicidal Thoughts:  No  Homicidal Thoughts:  No  Memory:  Immediate;   Good Recent;   Good Remote;   Good  Judgement:  Intact  Insight:  Present  Psychomotor Activity:  NA  Concentration:  Concentration: Good and Attention Span: Good  Recall:  Good  Fund of Knowledge:  Good  Language:  Good  Akathisia:  No  Handed:  Right  AIMS (if indicated):     Assets:  Communication Skills Desire for Improvement Housing Transportation  ADL's:  Intact  Cognition:  WNL  Sleep:   ok      Assessment and Plan: Schizoaffective disorder, depressed type.  Anxiety.  I reviewed his medication.  He is now taking gabapentin 300 mg up to 3 times a day to help his neck pain but I explained that it can also help his anxiety.  Patient admitted marginal improvement in his pain and hoping it helps his anxiety.  He is also scheduled to have MRI and rating the insurance to get approved.  Patient does not want to change the Abilify since it is working his paranoia, depression.  Continue Abilify 10  mg daily.  He will have a blood work in coming weeks including hemoglobin A1c.  His last hemoglobin A1c was 5.2.  Recommended to call us back if is any question or any concern.  Follow-up in 3 months.  Follow Up Instructions:    I discussed the assessment and treatment plan with the patient. The patient was provided an opportunity to ask questions and all were answered. The patient agreed with the plan and demonstrated an understanding of the instructions.   The patient was advised to call back or seek an in-person evaluation if the symptoms worsen or if the condition fails to improve as  anticipated.  I provided 20 minutes of non-face-to-face time during this encounter.   Kathlee Nations, MD

## 2021-03-24 ENCOUNTER — Other Ambulatory Visit: Payer: Self-pay | Admitting: Rehabilitation

## 2021-03-24 DIAGNOSIS — M5412 Radiculopathy, cervical region: Secondary | ICD-10-CM

## 2021-03-27 ENCOUNTER — Ambulatory Visit
Admission: RE | Admit: 2021-03-27 | Discharge: 2021-03-27 | Disposition: A | Discharge status: Home / Self Care | Payer: PPO | Source: Ambulatory Visit | Attending: Rehabilitation | Admitting: Rehabilitation

## 2021-03-27 DIAGNOSIS — M4802 Spinal stenosis, cervical region: Secondary | ICD-10-CM | POA: Diagnosis not present

## 2021-03-27 DIAGNOSIS — M5412 Radiculopathy, cervical region: Secondary | ICD-10-CM

## 2021-03-27 DIAGNOSIS — M542 Cervicalgia: Secondary | ICD-10-CM | POA: Diagnosis not present

## 2021-04-06 DIAGNOSIS — E119 Type 2 diabetes mellitus without complications: Secondary | ICD-10-CM | POA: Diagnosis not present

## 2021-04-13 DIAGNOSIS — G4733 Obstructive sleep apnea (adult) (pediatric): Secondary | ICD-10-CM | POA: Diagnosis not present

## 2021-04-14 DIAGNOSIS — M48062 Spinal stenosis, lumbar region with neurogenic claudication: Secondary | ICD-10-CM | POA: Diagnosis not present

## 2021-04-14 DIAGNOSIS — M5412 Radiculopathy, cervical region: Secondary | ICD-10-CM | POA: Diagnosis not present

## 2021-04-14 DIAGNOSIS — Z6834 Body mass index (BMI) 34.0-34.9, adult: Secondary | ICD-10-CM | POA: Diagnosis not present

## 2021-04-29 DIAGNOSIS — M5412 Radiculopathy, cervical region: Secondary | ICD-10-CM | POA: Diagnosis not present

## 2021-05-06 DIAGNOSIS — M5412 Radiculopathy, cervical region: Secondary | ICD-10-CM | POA: Diagnosis not present

## 2021-05-06 DIAGNOSIS — M48062 Spinal stenosis, lumbar region with neurogenic claudication: Secondary | ICD-10-CM | POA: Diagnosis not present

## 2021-05-11 DIAGNOSIS — G4733 Obstructive sleep apnea (adult) (pediatric): Secondary | ICD-10-CM | POA: Diagnosis not present

## 2021-05-11 DIAGNOSIS — G4712 Idiopathic hypersomnia without long sleep time: Secondary | ICD-10-CM | POA: Diagnosis not present

## 2021-05-23 DIAGNOSIS — G4733 Obstructive sleep apnea (adult) (pediatric): Secondary | ICD-10-CM | POA: Diagnosis not present

## 2021-06-08 DIAGNOSIS — E1169 Type 2 diabetes mellitus with other specified complication: Secondary | ICD-10-CM | POA: Diagnosis not present

## 2021-06-10 DIAGNOSIS — M5412 Radiculopathy, cervical region: Secondary | ICD-10-CM | POA: Diagnosis not present

## 2021-06-10 DIAGNOSIS — M48062 Spinal stenosis, lumbar region with neurogenic claudication: Secondary | ICD-10-CM | POA: Diagnosis not present

## 2021-06-15 ENCOUNTER — Telehealth (HOSPITAL_BASED_OUTPATIENT_CLINIC_OR_DEPARTMENT_OTHER): Payer: PPO | Admitting: Psychiatry

## 2021-06-15 ENCOUNTER — Encounter (HOSPITAL_COMMUNITY): Payer: Self-pay | Admitting: Psychiatry

## 2021-06-15 ENCOUNTER — Other Ambulatory Visit: Payer: Self-pay

## 2021-06-15 DIAGNOSIS — F251 Schizoaffective disorder, depressive type: Secondary | ICD-10-CM | POA: Diagnosis not present

## 2021-06-15 DIAGNOSIS — F419 Anxiety disorder, unspecified: Secondary | ICD-10-CM | POA: Diagnosis not present

## 2021-06-15 MED ORDER — ARIPIPRAZOLE 10 MG PO TABS: 10.0000 mg | ORAL_TABLET | Freq: Every day | ORAL | 0 refills | Status: DC

## 2021-06-15 NOTE — Progress Notes (Signed)
Virtual Visit via Video Note  I connected with Randall Mccann on 06/15/21 at  2:00 PM EST by a video enabled telemedicine application and verified that I am speaking with the correct person using two identifiers.  Location: Patient: In Car Provider: Office   I discussed the limitations of evaluation and management by telemedicine and the availability of in person appointments. The patient expressed understanding and agreed to proceed.  History of Present Illness: Patient is evaluated by phone session.  He is compliant with Abilify 10 mg.  He still have some time trust issues and paranoia in the public but overall his mood is stable.  He is in a steady relationship with a woman named Randall Mccann.  He reported relationship is going okay for more than 6 months.  He denies any irritability, anger, severe mood swings.  Recently his physician change gabapentin to Lyrica.  He liked Lyrica and he is taking 25 mg twice a day.  His neck pain is somewhat better.  He reported his appetite is okay and his weight is stable.  He has no tremors, shakes or any EPS.  He denies any panic attack.  He denies any suicidal thoughts.  He like to keep his Abilify since it is working well for him.   Past Psychiatric History: Reviewed. H/O irritability, anger, paranoia and trust issues since school age.  One inpatient as teenager and then twice in 1998.  No h/o suicidal attempt.  Tried lithium, Haldol, Risperdal and Paxil but no recall. H/O using drugs and alcohol.     Psychiatric Specialty Exam: Physical Exam  Review of Systems  Weight 189 lb (85.7 kg).There is no height or weight on file to calculate BMI.  General Appearance: Casual  Eye Contact:  Fair  Speech:  Slow  Volume:  Normal  Mood:  Anxious  Affect:  Congruent  Thought Process:  Goal Directed  Orientation:  Full (Time, Place, and Person)  Thought Content:  Logical  Suicidal Thoughts:  No  Homicidal Thoughts:  No  Memory:  Immediate;    Fair Recent;   Good Remote;   Fair  Judgement:  Intact  Insight:  Present  Psychomotor Activity:  Normal  Concentration:  Concentration: Fair and Attention Span: Fair  Recall:  Good  Fund of Knowledge:  Good  Language:  Good  Akathisia:  No  Handed:  Right  AIMS (if indicated):     Assets:  Communication Skills Desire for Improvement Housing Transportation  ADL's:  Intact  Cognition:  WNL  Sleep:   ok      Assessment and Plan: Schizoaffective disorder, depressed type.  Anxiety.  I reviewed his current medication.  He is no longer taking gabapentin and started taking Lyrica 25 mg twice a day which helps his shoulder pain.  Discussed medication side effects and benefits.  Patient does not want to change his Abilify since it is helping his paranoia and anxiety.  Continue Abilify 10 mg daily.  Recommended to call us back if there is any question or any concern.  Follow-up in 3 months.  Follow Up Instructions:    I discussed the assessment and treatment plan with the patient. The patient was provided an opportunity to ask questions and all were answered. The patient agreed with the plan and demonstrated an understanding of the instructions.   The patient was advised to call back or seek an in-person evaluation if the symptoms worsen or if the condition fails to improve as anticipated.  I provided  15 minutes of non-face-to-face time during this encounter.   Kathlee Nations, MD

## 2021-06-22 DIAGNOSIS — G4733 Obstructive sleep apnea (adult) (pediatric): Secondary | ICD-10-CM | POA: Diagnosis not present

## 2021-07-09 DIAGNOSIS — M48062 Spinal stenosis, lumbar region with neurogenic claudication: Secondary | ICD-10-CM | POA: Diagnosis not present

## 2021-07-09 DIAGNOSIS — M5412 Radiculopathy, cervical region: Secondary | ICD-10-CM | POA: Diagnosis not present

## 2021-07-17 DIAGNOSIS — G4733 Obstructive sleep apnea (adult) (pediatric): Secondary | ICD-10-CM | POA: Diagnosis not present

## 2021-08-12 DIAGNOSIS — G4733 Obstructive sleep apnea (adult) (pediatric): Secondary | ICD-10-CM | POA: Diagnosis not present

## 2021-09-08 DIAGNOSIS — E782 Mixed hyperlipidemia: Secondary | ICD-10-CM | POA: Diagnosis not present

## 2021-09-08 DIAGNOSIS — Z79899 Other long term (current) drug therapy: Secondary | ICD-10-CM | POA: Diagnosis not present

## 2021-09-08 DIAGNOSIS — M5412 Radiculopathy, cervical region: Secondary | ICD-10-CM | POA: Insufficient documentation

## 2021-09-08 DIAGNOSIS — E6609 Other obesity due to excess calories: Secondary | ICD-10-CM | POA: Diagnosis not present

## 2021-09-08 DIAGNOSIS — E119 Type 2 diabetes mellitus without complications: Secondary | ICD-10-CM | POA: Diagnosis not present

## 2021-09-08 DIAGNOSIS — G4710 Hypersomnia, unspecified: Secondary | ICD-10-CM | POA: Diagnosis not present

## 2021-09-08 DIAGNOSIS — F251 Schizoaffective disorder, depressive type: Secondary | ICD-10-CM | POA: Diagnosis not present

## 2021-09-08 DIAGNOSIS — I10 Essential (primary) hypertension: Secondary | ICD-10-CM | POA: Diagnosis not present

## 2021-09-08 DIAGNOSIS — M5030 Other cervical disc degeneration, unspecified cervical region: Secondary | ICD-10-CM | POA: Diagnosis not present

## 2021-09-08 DIAGNOSIS — Z87891 Personal history of nicotine dependence: Secondary | ICD-10-CM | POA: Diagnosis not present

## 2021-09-08 DIAGNOSIS — Z6832 Body mass index (BMI) 32.0-32.9, adult: Secondary | ICD-10-CM | POA: Diagnosis not present

## 2021-09-11 DIAGNOSIS — G4733 Obstructive sleep apnea (adult) (pediatric): Secondary | ICD-10-CM | POA: Diagnosis not present

## 2021-09-14 ENCOUNTER — Encounter (HOSPITAL_COMMUNITY): Payer: Self-pay | Admitting: Psychiatry

## 2021-09-14 ENCOUNTER — Telehealth (HOSPITAL_BASED_OUTPATIENT_CLINIC_OR_DEPARTMENT_OTHER): Payer: PPO | Admitting: Psychiatry

## 2021-09-14 DIAGNOSIS — F251 Schizoaffective disorder, depressive type: Secondary | ICD-10-CM | POA: Diagnosis not present

## 2021-09-14 DIAGNOSIS — Z125 Encounter for screening for malignant neoplasm of prostate: Secondary | ICD-10-CM | POA: Diagnosis not present

## 2021-09-14 DIAGNOSIS — E6609 Other obesity due to excess calories: Secondary | ICD-10-CM | POA: Diagnosis not present

## 2021-09-14 DIAGNOSIS — E119 Type 2 diabetes mellitus without complications: Secondary | ICD-10-CM | POA: Diagnosis not present

## 2021-09-14 DIAGNOSIS — F419 Anxiety disorder, unspecified: Secondary | ICD-10-CM

## 2021-09-14 DIAGNOSIS — Z131 Encounter for screening for diabetes mellitus: Secondary | ICD-10-CM | POA: Diagnosis not present

## 2021-09-14 DIAGNOSIS — M5030 Other cervical disc degeneration, unspecified cervical region: Secondary | ICD-10-CM | POA: Diagnosis not present

## 2021-09-14 DIAGNOSIS — M5412 Radiculopathy, cervical region: Secondary | ICD-10-CM | POA: Diagnosis not present

## 2021-09-14 DIAGNOSIS — E782 Mixed hyperlipidemia: Secondary | ICD-10-CM | POA: Diagnosis not present

## 2021-09-14 DIAGNOSIS — Z6833 Body mass index (BMI) 33.0-33.9, adult: Secondary | ICD-10-CM | POA: Diagnosis not present

## 2021-09-14 DIAGNOSIS — I10 Essential (primary) hypertension: Secondary | ICD-10-CM | POA: Diagnosis not present

## 2021-09-14 DIAGNOSIS — G4710 Hypersomnia, unspecified: Secondary | ICD-10-CM | POA: Diagnosis not present

## 2021-09-14 MED ORDER — ARIPIPRAZOLE 10 MG PO TABS: 10.0000 mg | ORAL_TABLET | Freq: Every day | ORAL | 0 refills | Status: DC

## 2021-09-14 NOTE — Progress Notes (Signed)
Virtual Visit via Video Note ? ?I connected with Randall Mccann on 09/14/21 at  3:20 PM EDT by a video enabled telemedicine application and verified that I am speaking with the correct person using two identifiers. ? ?Location: ?Patient: Home ?Provider: Home Office ?  ?I discussed the limitations of evaluation and management by telemedicine and the availability of in person appointments. The patient expressed understanding and agreed to proceed. ? ?History of Present Illness: ?Patient is evaluated by video session.  He is taking Abilify 10 mg.  He admitted there are times when he is more irritable, agitated and angry.  Though he denies any violence but admitted to having trust issues and disappointed from the people.  Recently he is having issues finding people to fix his garage.  He is also not happy because he gained excessive weight.  He is not sure what triggered but may be stopping the Ozempic has caused weight gain.  His previous PCP left and now he is established care with Dr. Estill Batten in [place].  He is scheduled for blood work today and then he will discuss the results with his PCP.  He admitted sometimes impulsive eating and not following his diet plan.  He is also not doing sleep hygiene and some nights he sleeps early and some nights he sleeps late.  His biggest concern is his back pain.  He is taking Lyrica 25 mg twice a day but sometimes he does not feel it helps as much.  He denies any panic attack.  He denies any suicidal thoughts or homicidal thought but admitted paranoid and does not like to be around people.  He does go to zoo almost every week with his elderly mother who has lifetime membership.  He enjoys going to [employer].he reported that is the only thing he do but not enough to keep his weight under control.  He has no tremors, shakes or any EPS. ? ?Past Psychiatric History: Reviewed. ?H/O irritability, anger, paranoia and trust issues since school age.  One inpatient as teenager and  then twice in 1998.  No h/o suicidal attempt.  Tried lithium, Haldol, Risperdal and Paxil but no recall. H/O using drugs and alcohol.   ? ?Psychiatric Specialty Exam: ?Physical Exam  ?Review of Systems  ?Weight 216 lb (98 kg).Body mass index is 32.84 kg/m?.  ?General Appearance: Guarded  ?Eye Contact:  Fair  ?Speech:  Normal Rate  ?Volume:  Decreased  ?Mood:  Anxious, Dysphoric, and Irritable  ?Affect:  Congruent  ?Thought Process:  Goal Directed  ?Orientation:  Full (Time, Place, and Person)  ?Thought Content:  Paranoid Ideation and Rumination  ?Suicidal Thoughts:  No  ?Homicidal Thoughts:  No  ?Memory:  Immediate;   Good ?Recent;   Good ?Remote;   Good  ?Judgement:  Good  ?Insight:  Fair  ?Psychomotor Activity:  Normal  ?Concentration:  Concentration: Fair and Attention Span: Fair  ?Recall:  Good  ?Fund of Knowledge:  Good  ?Language:  Good  ?Akathisia:  No  ?Handed:  Right  ?AIMS (if indicated):     ?Assets:  Communication Skills ?Desire for Improvement ?Housing ?Transportation  ?ADL's:  Intact  ?Cognition:  WNL  ?Sleep:   4 hrs  ? ? ? ? ?Assessment and Plan: ?Schizoaffective disorder, depressed type.  Anxiety. ? ?I discussed his excessive weight gain, irritability and anger.  He had a blood work today and hoping to discuss with his PCP to go back on Ozempic.  He was prediabetic and he  was given Ozempic with weight loss but not taking it lately.  I discussed that he can try going up on Lyrica up to 3 times a day to help his anxiety irritability which he is taking for neck pain.  I explained that can be used to help his anxiety.  We also discussed about other options like switching to a different medication or adding Lamictal but patient reluctant to change his psychotropic medication at this time.  He is also not interested in therapy.  He agree to give a try with Lyrica after he get clearance from his pain doctor.  He promised to give [place] a call back if symptoms do not improved.  We will follow up in 3  months. ? ?Follow Up Instructions: ? ?  ?I discussed the assessment and treatment plan with the patient. The patient was provided an opportunity to ask questions and all were answered. The patient agreed with the plan and demonstrated an understanding of the instructions. ?  ?The patient was advised to call back or seek an in-person evaluation if the symptoms worsen or if the condition fails to improve as anticipated. ? ?Collaboration of Care: Primary Care Provider AEB notes are available in epic to review. ? ?Patient/Guardian was advised Release of Information must be obtained prior to any record release in order to collaborate their care with an outside provider. Patient/Guardian was advised if they have not already done so to contact the registration department to sign all necessary forms in order for [place] to release information regarding their care.  ? ?Consent: Patient/Guardian gives verbal consent for treatment and assignment of benefits for services provided during this visit. Patient/Guardian expressed understanding and agreed to proceed.   ? ?I provided 25.  Minutes of non-face-to-face time during this encounter. ? ? ?Kathlee Nations, MD  ?

## 2021-10-08 DIAGNOSIS — J988 Other specified respiratory disorders: Secondary | ICD-10-CM | POA: Diagnosis not present

## 2021-10-12 DIAGNOSIS — G4733 Obstructive sleep apnea (adult) (pediatric): Secondary | ICD-10-CM | POA: Diagnosis not present

## 2021-10-13 DIAGNOSIS — E871 Hypo-osmolality and hyponatremia: Secondary | ICD-10-CM | POA: Diagnosis not present

## 2021-10-14 DIAGNOSIS — I10 Essential (primary) hypertension: Secondary | ICD-10-CM | POA: Diagnosis not present

## 2021-10-14 DIAGNOSIS — E871 Hypo-osmolality and hyponatremia: Secondary | ICD-10-CM | POA: Diagnosis not present

## 2021-11-11 DIAGNOSIS — G4733 Obstructive sleep apnea (adult) (pediatric): Secondary | ICD-10-CM | POA: Diagnosis not present

## 2021-11-16 DIAGNOSIS — G4733 Obstructive sleep apnea (adult) (pediatric): Secondary | ICD-10-CM | POA: Diagnosis not present

## 2021-11-16 DIAGNOSIS — G4711 Idiopathic hypersomnia with long sleep time: Secondary | ICD-10-CM | POA: Diagnosis not present

## 2021-11-16 DIAGNOSIS — E871 Hypo-osmolality and hyponatremia: Secondary | ICD-10-CM | POA: Diagnosis not present

## 2021-11-26 DIAGNOSIS — M48062 Spinal stenosis, lumbar region with neurogenic claudication: Secondary | ICD-10-CM | POA: Diagnosis not present

## 2021-11-26 DIAGNOSIS — M5412 Radiculopathy, cervical region: Secondary | ICD-10-CM | POA: Diagnosis not present

## 2021-12-02 DIAGNOSIS — E871 Hypo-osmolality and hyponatremia: Secondary | ICD-10-CM | POA: Diagnosis not present

## 2021-12-11 DIAGNOSIS — G4733 Obstructive sleep apnea (adult) (pediatric): Secondary | ICD-10-CM | POA: Diagnosis not present

## 2021-12-14 ENCOUNTER — Encounter (HOSPITAL_COMMUNITY): Payer: Self-pay | Admitting: Psychiatry

## 2021-12-14 ENCOUNTER — Telehealth (HOSPITAL_BASED_OUTPATIENT_CLINIC_OR_DEPARTMENT_OTHER): Payer: PPO | Admitting: Psychiatry

## 2021-12-14 DIAGNOSIS — F419 Anxiety disorder, unspecified: Secondary | ICD-10-CM

## 2021-12-14 DIAGNOSIS — F251 Schizoaffective disorder, depressive type: Secondary | ICD-10-CM

## 2021-12-14 MED ORDER — ARIPIPRAZOLE 10 MG PO TABS: 10.0000 mg | ORAL_TABLET | Freq: Every day | ORAL | 0 refills | Status: DC

## 2021-12-14 NOTE — Progress Notes (Signed)
Virtual Visit via Video Note  I connected with Randall Mccann on 12/14/21 at  3:00 PM EDT by a video enabled telemedicine application and verified that I am speaking with the correct person using two identifiers.  Location: Patient: Home Provider: Home Office   I discussed the limitations of evaluation and management by telemedicine and the availability of in person appointments. The patient expressed understanding and agreed to proceed.  History of Present Illness: Patient is evaluated by video session.  He is taking Abilify 10 mg and now getting Lyrica up to 3 times a day which was recommended to help his anxiety.  He noticed improvement and he is more calm.  Though he still have chronic trust issue and paranoia but denies any irritability, mood swing, anger.  He told his father died on 11/30/2022 after 10 days of hospitalization and that was a sad moment.  He did spend time with the mother.  Patient reported funeral went okay other than 1 incident that one of the family person had fainted in the funeral but no other issues.  He is sleeping 4 to 6 hours.  He is in contact with the sleep doctor and he agreed to give more time rather than adding any new medication.  Recently he has some issues with his sodium and he had edema but slowly and gradually edema is getting better.  He admitted excessive weight gain but now he had cut down his appetite and watching what he is eating and hoping he will get some weight loss.  He has chronic backache and receiving injections which helps.  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  He is taking some time going to do because trying to settled matter his father state.  Patient has a lifelong membership at [employer].  Denies any suicidal thoughts, homicidal thoughts.  He has paranoia but stable.  He has no tremors or shakes.  Past Psychiatric History: Reviewed. H/O irritability, anger,  paranoia and trust issues since school age.  One inpatient as teenager and then twice in 1998.  No h/o suicidal attempt.  Tried lithium, Haldol, Risperdal and Paxil but no recall. H/O using drugs and alcohol.    Psychiatric Specialty Exam: Physical Exam  Review of Systems  Weight 230 lb (104.3 kg).There is no height or weight on file to calculate BMI.  General Appearance: Casual  Eye Contact:  Good  Speech:  Clear and Coherent and Normal Rate  Volume:  Normal  Mood:  Dysphoric  Affect:  Congruent  Thought Process:  Goal Directed  Orientation:  Full (Time, Place, and Person)  Thought Content:  Paranoid Ideation and Rumination  Suicidal Thoughts:  No  Homicidal Thoughts:  No  Memory:  Immediate;   Good Recent;   Good Remote;   Good  Judgement:  Intact  Insight:  Present  Psychomotor Activity:  Normal  Concentration:  Concentration: Fair and Attention Span: Fair  Recall:  Good  Fund of Knowledge:  Good  Language:  Good  Akathisia:  No  Handed:  Right  AIMS (if indicated):     Assets:  Communication Skills Desire for Improvement Housing Transportation  ADL's:  Intact  Cognition:  WNL  Sleep:   4 -5 hrs      Assessment and Plan: Schizoaffective disorder, depressed type.  Anxiety.  I reviewed blood work results and other medication.  He is taking Lyrica 3  times a day which is provided by other provider and that really helped his anxiety and irritability.  I encouraged to continue getting medication but the current regimen dose of Lyrica from other provider.  I will continue Abilify 10 mg which is helping his paranoia.  Encourage walking, exercise and watch his calorie intake.  He is hoping once edema subsided he will able to lose weight.  Recommended to call us back if is any question or any concern.  Patient is not interested in therapy.  Follow-up in 3 months  Follow Up Instructions:    I discussed the assessment and treatment plan with the patient. The patient was  provided an opportunity to ask questions and all were answered. The patient agreed with the plan and demonstrated an understanding of the instructions.   The patient was advised to call back or seek an in-person evaluation if the symptoms worsen or if the condition fails to improve as anticipated.  Collaboration of Care: Primary Care Provider AEB notes are available in epic to review  Patient/Guardian was advised Release of Information must be obtained prior to any record release in order to collaborate their care with an outside provider. Patient/Guardian was advised if they have not already done so to contact the registration department to sign all necessary forms in order for [place] to release information regarding their care.   Consent: Patient/Guardian gives verbal consent for treatment and assignment of benefits for services provided during this visit. Patient/Guardian expressed understanding and agreed to proceed.    I provided 20 minutes of non-face-to-face time during this encounter.   Kathlee Nations, MD

## 2021-12-20 DIAGNOSIS — L550 Sunburn of first degree: Secondary | ICD-10-CM | POA: Diagnosis not present

## 2021-12-21 DIAGNOSIS — G4733 Obstructive sleep apnea (adult) (pediatric): Secondary | ICD-10-CM | POA: Diagnosis not present

## 2021-12-21 DIAGNOSIS — G4712 Idiopathic hypersomnia without long sleep time: Secondary | ICD-10-CM | POA: Diagnosis not present

## 2021-12-23 DIAGNOSIS — M48062 Spinal stenosis, lumbar region with neurogenic claudication: Secondary | ICD-10-CM | POA: Diagnosis not present

## 2022-01-06 DIAGNOSIS — G4733 Obstructive sleep apnea (adult) (pediatric): Secondary | ICD-10-CM | POA: Diagnosis not present

## 2022-01-13 DIAGNOSIS — M5412 Radiculopathy, cervical region: Secondary | ICD-10-CM | POA: Diagnosis not present

## 2022-01-13 DIAGNOSIS — M48062 Spinal stenosis, lumbar region with neurogenic claudication: Secondary | ICD-10-CM | POA: Diagnosis not present

## 2022-01-14 DIAGNOSIS — D485 Neoplasm of uncertain behavior of skin: Secondary | ICD-10-CM | POA: Diagnosis not present

## 2022-01-14 DIAGNOSIS — L509 Urticaria, unspecified: Secondary | ICD-10-CM | POA: Diagnosis not present

## 2022-02-02 DIAGNOSIS — G4733 Obstructive sleep apnea (adult) (pediatric): Secondary | ICD-10-CM | POA: Diagnosis not present

## 2022-02-18 DIAGNOSIS — J029 Acute pharyngitis, unspecified: Secondary | ICD-10-CM | POA: Diagnosis not present

## 2022-02-18 DIAGNOSIS — R070 Pain in throat: Secondary | ICD-10-CM | POA: Diagnosis not present

## 2022-02-18 DIAGNOSIS — R059 Cough, unspecified: Secondary | ICD-10-CM | POA: Diagnosis not present

## 2022-02-23 DIAGNOSIS — M47816 Spondylosis without myelopathy or radiculopathy, lumbar region: Secondary | ICD-10-CM | POA: Diagnosis not present

## 2022-03-04 DIAGNOSIS — E871 Hypo-osmolality and hyponatremia: Secondary | ICD-10-CM | POA: Diagnosis not present

## 2022-03-04 DIAGNOSIS — G4733 Obstructive sleep apnea (adult) (pediatric): Secondary | ICD-10-CM | POA: Diagnosis not present

## 2022-03-05 ENCOUNTER — Encounter (HOSPITAL_COMMUNITY): Payer: Self-pay | Admitting: Psychiatry

## 2022-03-05 ENCOUNTER — Ambulatory Visit (HOSPITAL_BASED_OUTPATIENT_CLINIC_OR_DEPARTMENT_OTHER): Payer: PPO | Admitting: Psychiatry

## 2022-03-05 DIAGNOSIS — F251 Schizoaffective disorder, depressive type: Secondary | ICD-10-CM

## 2022-03-05 DIAGNOSIS — F419 Anxiety disorder, unspecified: Secondary | ICD-10-CM

## 2022-03-05 MED ORDER — ARIPIPRAZOLE 10 MG PO TABS: 10.0000 mg | ORAL_TABLET | Freq: Every day | ORAL | 0 refills | Status: DC

## 2022-03-05 NOTE — Progress Notes (Signed)
Virtual Visit via Telephone Note  I connected with Lenn Sink on 03/05/22 at 11:40 AM EDT by telephone and verified that I am speaking with the correct person using two identifiers.  Location: Patient: Home Provider: Home Office   I discussed the limitations, risks, security and privacy concerns of performing an evaluation and management service by telephone and the availability of in person appointments. I also discussed with the patient that there may be a patient responsible charge related to this service. The patient expressed understanding and agreed to proceed.   History of Present Illness: Patient is evaluated by phone session.  He is taking Abilify 10 mg and he reported it helps his symptoms.  Patient has chronic paranoia and trust issues but his anger, irritability is stable.  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  His mother is taking care of father's estate with died in Nov 18, 2022.  Patient reported he does not go outside unless it is important.  He is a homebody and he feels uncomfortable around strangers.  He has multiple health issues including back pain and low sodium.  He sleeps 4 to 5 hours because his back stiff continued to take some time in the morning to start his day.  His last sodium was 136 which was done yesterday.  He reported no tremors, shakes or any EPS.  He had lost a few pounds since the last visit but his goal is to lose more weight.  He denies any panic attack.  Does not want to change his medication.  His blood sugar was high yesterday but he was not fasting.  He is not sure when he will have hemoglobin A1c but like to have it done and will discuss it with PCP on his next appointment.  He is not interested in therapy.    Past Psychiatric History: Reviewed. H/O irritability, anger, paranoia and trust issues since school  age.  One inpatient as teenager and then twice in 1998.  No h/o suicidal attempt.  Tried lithium, Haldol, Risperdal and Paxil but no recall. H/O using drugs and alcohol.    Psychiatric Specialty Exam: Physical Exam  Review of Systems  Weight 225 lb (102.1 kg).There is no height or weight on file to calculate BMI.  General Appearance: NA  Eye Contact:  NA  Speech:  Slow  Volume:  Decreased  Mood:  Dysphoric  Affect:  NA  Thought Process:  Descriptions of Associations: Intact  Orientation:  Full (Time, Place, and Person)  Thought Content:  Paranoid Ideation, trust issues  Suicidal Thoughts:  No  Homicidal Thoughts:  No  Memory:  Immediate;   Good Recent;   Good Remote;   Good  Judgement:  Intact  Insight:  Present  Psychomotor Activity:  NA  Concentration:  Concentration: Fair and Attention Span: Fair  Recall:  Good  Fund of Knowledge:  Fair  Language:  Good  Akathisia:  No  Handed:  Right  AIMS (if indicated):     Assets:  Communication Skills Desire for Improvement Housing Social Support Transportation  ADL's:  Intact  Cognition:  WNL  Sleep:   4-5 hrs      Assessment and Plan: Schizoaffective disorder, depressed type.  Anxiety.  I reviewed blood work results which was done yesterday.  His sodium is 136.  His BUN/creatinine is okay however his blood sugar  is high.  I encourage contact his PCP to have a hemoglobin A1c because yesterday blood was not fasting.  Encourage walking, exercise and calorie intake to be watched.  Patient does not want to change the medication because he feels his symptoms are present but stable.  I will continue Abilify 10 mg daily.  He is not interested in therapy.  Recommended to call us back if he has any question or any concern.  Follow-up in 3 months  Follow Up Instructions:    I discussed the assessment and treatment plan with the patient. The patient was provided an opportunity to ask questions and all were answered. The patient agreed  with the plan and demonstrated an understanding of the instructions.   The patient was advised to call back or seek an in-person evaluation if the symptoms worsen or if the condition fails to improve as anticipated.  Collaboration of Care: Other provider involved in patient's care AEB notes are available in epic to review.  Patient/Guardian was advised Release of Information must be obtained prior to any record release in order to collaborate their care with an outside provider. Patient/Guardian was advised if they have not already done so to contact the registration department to sign all necessary forms in order for [place] to release information regarding their care.   Consent: Patient/Guardian gives verbal consent for treatment and assignment of benefits for services provided during this visit. Patient/Guardian expressed understanding and agreed to proceed.    I provided 18 minutes of non-face-to-face time during this encounter.   Kathlee Nations, MD

## 2022-03-09 DIAGNOSIS — E119 Type 2 diabetes mellitus without complications: Secondary | ICD-10-CM | POA: Diagnosis not present

## 2022-03-09 DIAGNOSIS — Z6836 Body mass index (BMI) 36.0-36.9, adult: Secondary | ICD-10-CM | POA: Diagnosis not present

## 2022-03-09 DIAGNOSIS — E782 Mixed hyperlipidemia: Secondary | ICD-10-CM | POA: Diagnosis not present

## 2022-03-09 DIAGNOSIS — E871 Hypo-osmolality and hyponatremia: Secondary | ICD-10-CM | POA: Diagnosis not present

## 2022-03-09 DIAGNOSIS — I10 Essential (primary) hypertension: Secondary | ICD-10-CM | POA: Diagnosis not present

## 2022-03-17 ENCOUNTER — Telehealth (HOSPITAL_COMMUNITY): Payer: PPO | Admitting: Psychiatry

## 2022-03-17 DIAGNOSIS — M47816 Spondylosis without myelopathy or radiculopathy, lumbar region: Secondary | ICD-10-CM | POA: Diagnosis not present

## 2022-03-29 DIAGNOSIS — G4712 Idiopathic hypersomnia without long sleep time: Secondary | ICD-10-CM | POA: Diagnosis not present

## 2022-03-29 DIAGNOSIS — E669 Obesity, unspecified: Secondary | ICD-10-CM | POA: Diagnosis not present

## 2022-03-29 DIAGNOSIS — G4733 Obstructive sleep apnea (adult) (pediatric): Secondary | ICD-10-CM | POA: Diagnosis not present

## 2022-04-05 DIAGNOSIS — G4733 Obstructive sleep apnea (adult) (pediatric): Secondary | ICD-10-CM | POA: Diagnosis not present

## 2022-04-07 DIAGNOSIS — M47816 Spondylosis without myelopathy or radiculopathy, lumbar region: Secondary | ICD-10-CM | POA: Diagnosis not present

## 2022-04-25 DIAGNOSIS — R197 Diarrhea, unspecified: Secondary | ICD-10-CM | POA: Diagnosis not present

## 2022-04-25 DIAGNOSIS — J069 Acute upper respiratory infection, unspecified: Secondary | ICD-10-CM | POA: Diagnosis not present

## 2022-04-27 DIAGNOSIS — R197 Diarrhea, unspecified: Secondary | ICD-10-CM | POA: Diagnosis not present

## 2022-04-28 DIAGNOSIS — R197 Diarrhea, unspecified: Secondary | ICD-10-CM | POA: Diagnosis not present

## 2022-05-05 DIAGNOSIS — M48062 Spinal stenosis, lumbar region with neurogenic claudication: Secondary | ICD-10-CM | POA: Diagnosis not present

## 2022-05-05 DIAGNOSIS — M47816 Spondylosis without myelopathy or radiculopathy, lumbar region: Secondary | ICD-10-CM | POA: Diagnosis not present

## 2022-05-05 DIAGNOSIS — G4733 Obstructive sleep apnea (adult) (pediatric): Secondary | ICD-10-CM | POA: Diagnosis not present

## 2022-05-11 DIAGNOSIS — G4712 Idiopathic hypersomnia without long sleep time: Secondary | ICD-10-CM | POA: Diagnosis not present

## 2022-05-11 DIAGNOSIS — G4733 Obstructive sleep apnea (adult) (pediatric): Secondary | ICD-10-CM | POA: Diagnosis not present

## 2022-05-26 DIAGNOSIS — E119 Type 2 diabetes mellitus without complications: Secondary | ICD-10-CM | POA: Diagnosis not present

## 2022-06-04 ENCOUNTER — Telehealth (INDEPENDENT_AMBULATORY_CARE_PROVIDER_SITE_OTHER): Payer: PPO | Admitting: Psychiatry

## 2022-06-04 ENCOUNTER — Encounter (HOSPITAL_COMMUNITY): Payer: Self-pay | Admitting: Psychiatry

## 2022-06-04 DIAGNOSIS — F419 Anxiety disorder, unspecified: Secondary | ICD-10-CM

## 2022-06-04 DIAGNOSIS — F251 Schizoaffective disorder, depressive type: Secondary | ICD-10-CM

## 2022-06-04 MED ORDER — ARIPIPRAZOLE 10 MG PO TABS: 10.0000 mg | ORAL_TABLET | Freq: Every day | ORAL | 0 refills | Status: DC

## 2022-06-04 NOTE — Progress Notes (Signed)
Virtual Visit via Video Note  I connected with Randall Mccann on 06/04/22 at 11:40 AM EST by a video enabled telemedicine application and verified that I am speaking with the correct person using two identifiers.  Location: Patient: Home Provider: Home Office   I discussed the limitations of evaluation and management by telemedicine and the availability of in person appointments. The patient expressed understanding and agreed to proceed.  History of Present Illness: Patient is evaluated by video session.  He is compliant with Abilify.  He reported his symptoms are stable and manageable.  He has chronic paranoia and trust issues but denies any anger, mood swings.  He is busy taking care of his mother.  He is working close with his brother to help their mother.  He has no tremors or shakes or any EPS.  He is taking multiple medication for blood pressure and recently had a visit with his PCP.  He is not sure but recall his last hemoglobin A1c was 6.1.  He has another appointment coming up very soon and he will discuss more about his results faxed to [place].  He does not want to change the medication since he feels it is working.  He admitted 3 pounds weight gain because not able to walk due to cold weather.  He is not interested in therapy.  He denies any hopelessness or worthlessness or any suicidal thoughts.   Past Psychiatric History: Reviewed. H/O irritability, anger, paranoia and trust issues since school age.  One inpatient as teenager and then twice in 1998.  No h/o suicidal attempt.  Tried lithium, Haldol, Risperdal and Paxil but no recall. H/O using drugs and alcohol.     Psychiatric Specialty Exam: Physical Exam  Review of Systems  Weight 230 lb (104.3 kg).There is no height or weight on file to calculate BMI.  General Appearance: Casual  Eye Contact:  Fair  Speech:  Clear and Coherent and Slow  Volume:  Normal  Mood:  Euthymic  Affect:  NA  Thought Process:  Goal Directed   Orientation:  Full (Time, Place, and Person)  Thought Content:  Paranoid Ideation  Suicidal Thoughts:  No  Homicidal Thoughts:  No  Memory:  Immediate;   Good Recent;   Good Remote;   Good  Judgement:  Good  Insight:  Present  Psychomotor Activity:  Normal  Concentration:  Concentration: Fair and Attention Span: Fair  Recall:  Good  Fund of Knowledge:  Good  Language:  Good  Akathisia:  No  Handed:  Right  AIMS (if indicated):     Assets:  Communication Skills Desire for Improvement Housing Resilience Transportation  ADL's:  Intact  Cognition:  WNL  Sleep:   ok      Assessment and Plan: Schizophrenia chronic paranoid type.  Anxiety.  Patient is stable on his current medication.  Continue Abilify 10 mg daily.  Recommend to ask his PCP to have his blood work sent to [place].  Follow-up in 3 months unless he need to see earlier.    Follow Up Instructions:    I discussed the assessment and treatment plan with the patient. The patient was provided an opportunity to ask questions and all were answered. The patient agreed with the plan and demonstrated an understanding of the instructions.   The patient was advised to call back or seek an in-person evaluation if the symptoms worsen or if the condition fails to improve as anticipated.  Collaboration of Care: Other provider involved in patient's  care AEB notes are available in epic to review.  Patient/Guardian was advised Release of Information must be obtained prior to any record release in order to collaborate their care with an outside provider. Patient/Guardian was advised if they have not already done so to contact the registration department to sign all necessary forms in order for [place] to release information regarding their care.   Consent: Patient/Guardian gives verbal consent for treatment and assignment of benefits for services provided during this visit. Patient/Guardian expressed understanding and agreed to proceed.     I provided 18 minutes of non-face-to-face time during this encounter.   Kathlee Nations, MD

## 2022-08-24 DIAGNOSIS — K219 Gastro-esophageal reflux disease without esophagitis: Secondary | ICD-10-CM | POA: Insufficient documentation

## 2022-08-31 ENCOUNTER — Telehealth (HOSPITAL_BASED_OUTPATIENT_CLINIC_OR_DEPARTMENT_OTHER): Payer: PPO | Admitting: Psychiatry

## 2022-08-31 ENCOUNTER — Encounter (HOSPITAL_COMMUNITY): Payer: Self-pay | Admitting: Psychiatry

## 2022-08-31 DIAGNOSIS — F419 Anxiety disorder, unspecified: Secondary | ICD-10-CM

## 2022-08-31 DIAGNOSIS — F251 Schizoaffective disorder, depressive type: Secondary | ICD-10-CM | POA: Diagnosis not present

## 2022-08-31 MED ORDER — ARIPIPRAZOLE 10 MG PO TABS: 10.0000 mg | ORAL_TABLET | Freq: Every day | ORAL | 0 refills | Status: DC

## 2022-08-31 NOTE — Progress Notes (Signed)
[facility] MD Virtual Progress Note   Patient Location: Home Provider Location: Office  I connect with patient by telephone and verified that I am speaking with correct person by using two identifiers. I discussed the limitations of evaluation and management by telemedicine and the availability of in person appointments. I also discussed with the patient that there may be a patient responsible charge related to this service. The patient expressed understanding and agreed to proceed.  Randall Mccann [id number] 57 y.o.  08/31/2022 4:10 PM  History of Present Illness:  Patient evaluated by phone session.  His video camera did not work.  He feels that things are going okay.  Denies any anger, mood swing, paranoia or any hallucination.  He is taking Abilify and recently had a blood work.  His hemoglobin A1c is 6.4 and he had lost 15 pounds and started taking Ozempic on a regular basis.  He feels more active, energy and motivated to do things.  Since the weather is better he like to spend more time outside.  He denies any hallucination, suicidal thoughts.  He has no tremors or shakes or any EPS.  Currently he is not working but there is a possibility he may work starting [employer] Day weekend as needed.  He does not want to change the medication.  Past Psychiatric History: H/O irritability, anger, paranoia and trust issues since school age.  One inpatient as teenager and then twice in 1998.  No h/o suicidal attempt.  Tried lithium, Haldol, Risperdal and Paxil but no recall. H/O using drugs and alcohol.      Outpatient Encounter Medications as of 08/31/2022  Medication Sig   ARIPiprazole (ABILIFY) 10 MG tablet Take 1 tablet (10 mg total) by mouth daily.   atorvastatin (LIPITOR) 40 MG tablet Take 40 mg by mouth daily.   cloNIDine (CATAPRES) 0.1 MG tablet Take 0.1 mg by mouth 2 (two) times daily.   doxycycline (PERIOSTAT) 20 MG tablet Take 20 mg by mouth 2 (two) times daily with a  meal. (Patient not taking: Reported on 06/15/2021)   Hyaluronate Sodium (BIONECT) 0.2 % CREA Apply topically.   lisinopril-hydrochlorothiazide (ZESTORETIC) 20-25 MG tablet Take 2 tablets by mouth daily.   metroNIDAZOLE (METROGEL) 1 % gel Apply topically daily.   modafinil (PROVIGIL) 200 MG tablet    Omega-3 Fatty Acids (FISH OIL) 1000 MG CAPS Take by mouth.   omeprazole (PRILOSEC) 40 MG capsule    OZEMPIC, 0.25 OR 0.5 MG/DOSE, 2 MG/1.5ML SOPN Inject 0.5 mg into the skin once a week. (Patient not taking: Reported on 03/18/2021)   pregabalin (LYRICA) 25 MG capsule Take 25 mg by mouth 2 (two) times daily.   spironolactone (ALDACTONE) 25 MG tablet Take 25 mg by mouth daily.   triamcinolone ointment (KENALOG) 0.1 %    No facility-administered encounter medications on file as of 08/31/2022.    No results found for this or any previous visit (from the past 2160 hour(s)).   Psychiatric Specialty Exam: Physical Exam  Review of Systems  Weight 214 lb (97.1 kg).There is no height or weight on file to calculate BMI.  General Appearance: NA  Eye Contact:  NA  Speech:  Slow  Volume:  Decreased  Mood:  Euthymic  Affect:  NA  Thought Process:  Descriptions of Associations: Intact  Orientation:  Full (Time, Place, and Person)  Thought Content:  Paranoid Ideation and Rumination  Suicidal Thoughts:  No  Homicidal Thoughts:  No  Memory:  Immediate;   Good  Recent;   Good Remote;   Good  Judgement:  Intact  Insight:  Present  Psychomotor Activity:  NA  Concentration:  Concentration: Fair and Attention Span: Fair  Recall:  Good  Fund of Knowledge:  Good  Language:  Good  Akathisia:  No  Handed:  Right  AIMS (if indicated):     Assets:  Communication Skills Desire for Improvement Housing Transportation  ADL's:  Intact  Cognition:  WNL  Sleep:  better     Assessment/Plan: Schizoaffective disorder, depressive type - Plan: ARIPiprazole (ABILIFY) 10 MG tablet  Anxiety - Plan: ARIPiprazole  (ABILIFY) 10 MG tablet  Patient is stable on current medication.  Last hemoglobin A1c 6.4 and he lost 15 pounds and started taking Ozempic on a regular basis.  His paranoia, anxiety is under control.  Continue Abilify 10 mg daily.  Recommended to call us back if is any question or any concern.  Follow-up in 3 months.   Follow Up Instructions:     I discussed the assessment and treatment plan with the patient. The patient was provided an opportunity to ask questions and all were answered. The patient agreed with the plan and demonstrated an understanding of the instructions.   The patient was advised to call back or seek an in-person evaluation if the symptoms worsen or if the condition fails to improve as anticipated.    Collaboration of Care: Other provider involved in patient's care AEB notes are available in epic to review.  Patient/Guardian was advised Release of Information must be obtained prior to any record release in order to collaborate their care with an outside provider. Patient/Guardian was advised if they have not already done so to contact the registration department to sign all necessary forms in order for [place] to release information regarding their care.   Consent: Patient/Guardian gives verbal consent for treatment and assignment of benefits for services provided during this visit. Patient/Guardian expressed understanding and agreed to proceed.     I provided 21 minutes of non face to face time during this encounter.  Kathlee Nations, MD 08/31/2022

## 2022-09-21 ENCOUNTER — Ambulatory Visit
Admission: RE | Admit: 2022-09-21 | Discharge: 2022-09-21 | Disposition: A | Discharge status: Home / Self Care | Payer: PPO | Source: Ambulatory Visit | Attending: Gastroenterology | Admitting: Gastroenterology

## 2022-09-21 ENCOUNTER — Other Ambulatory Visit: Payer: Self-pay | Admitting: Gastroenterology

## 2022-09-21 DIAGNOSIS — R109 Unspecified abdominal pain: Secondary | ICD-10-CM

## 2022-09-27 IMAGING — CR DG ABDOMEN 2V
2 series · 2 of 2 positions shown · non-contrast
Comparison: None.

CLINICAL DATA: Nausea, vomiting

EXAM:
ABDOMEN - 2 VIEW

[t abdomen supine]
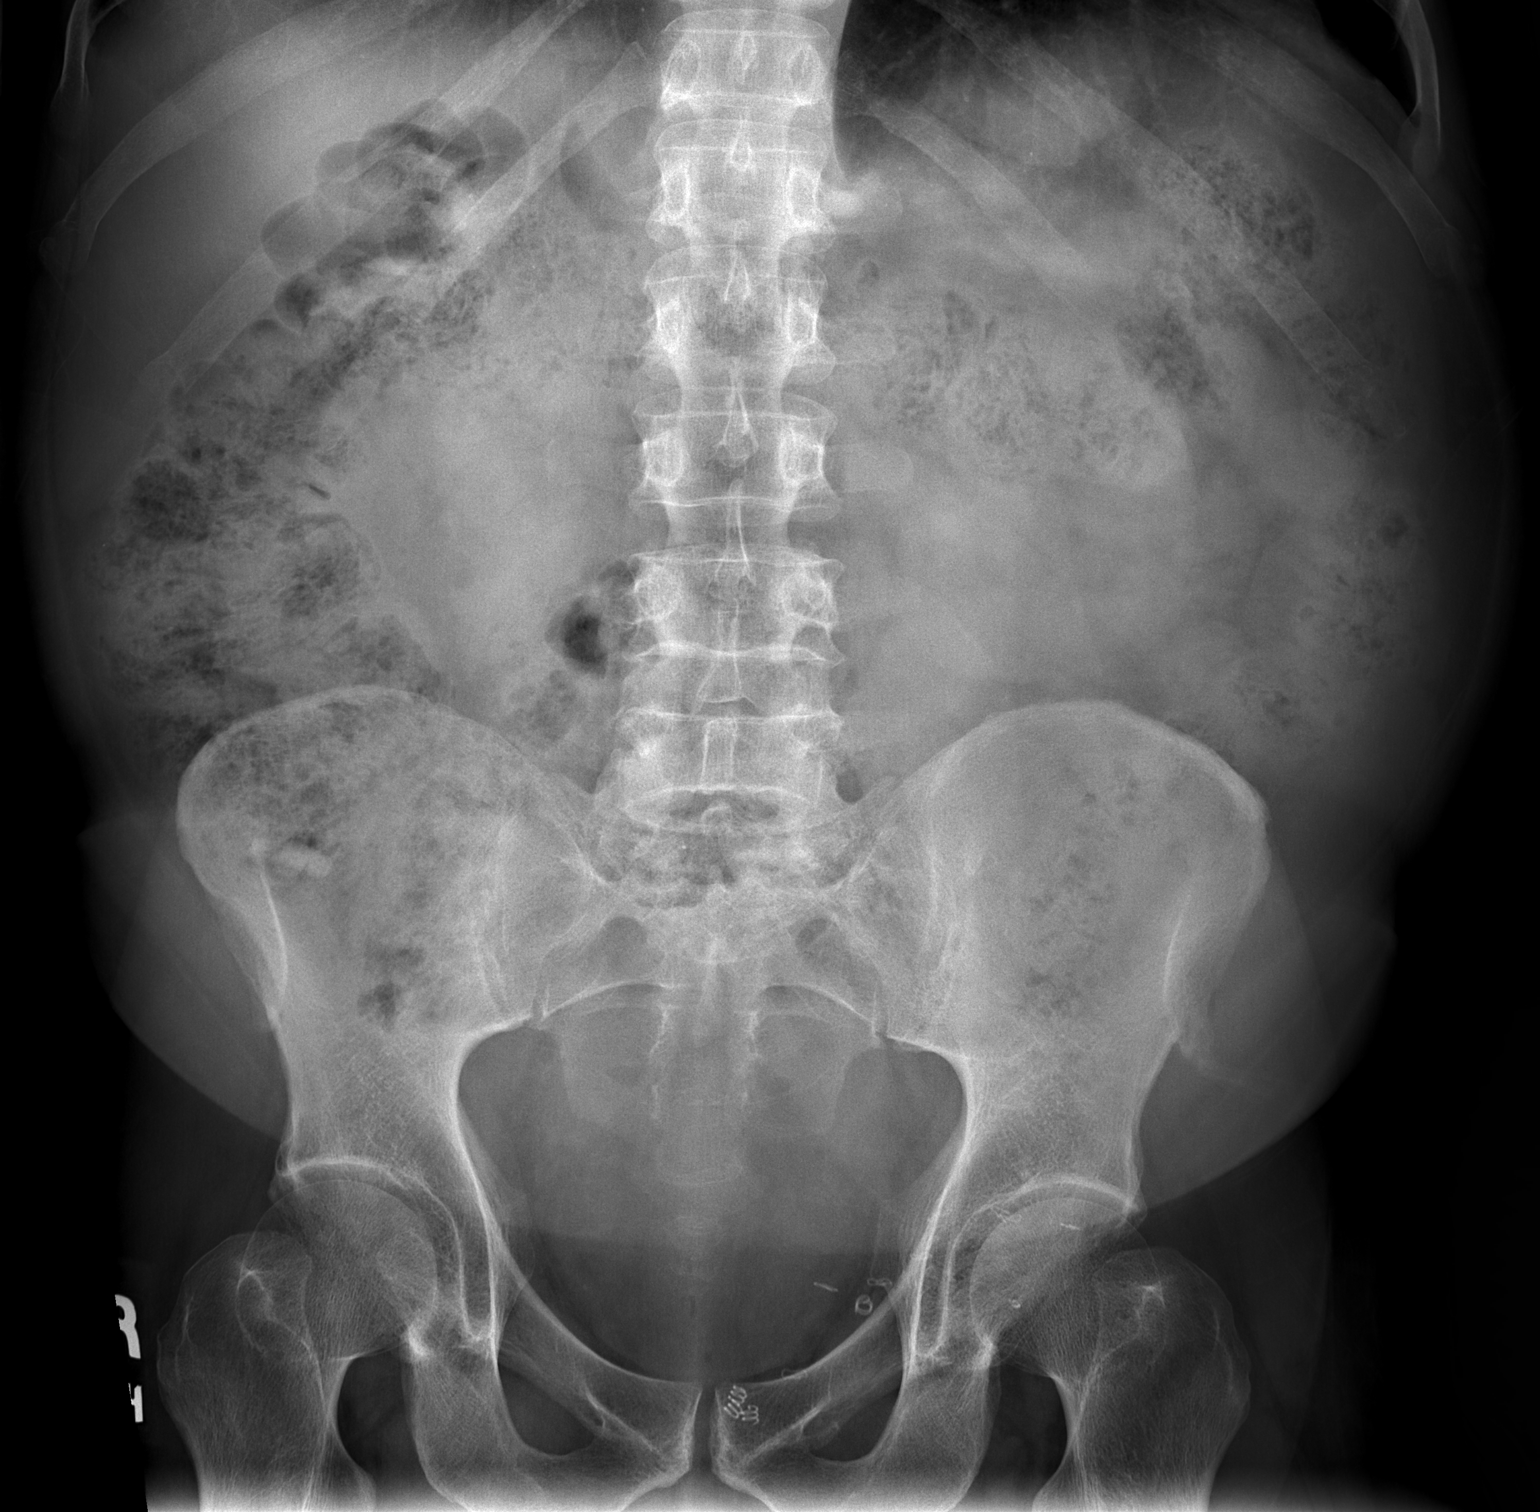

[w abdomen upright]
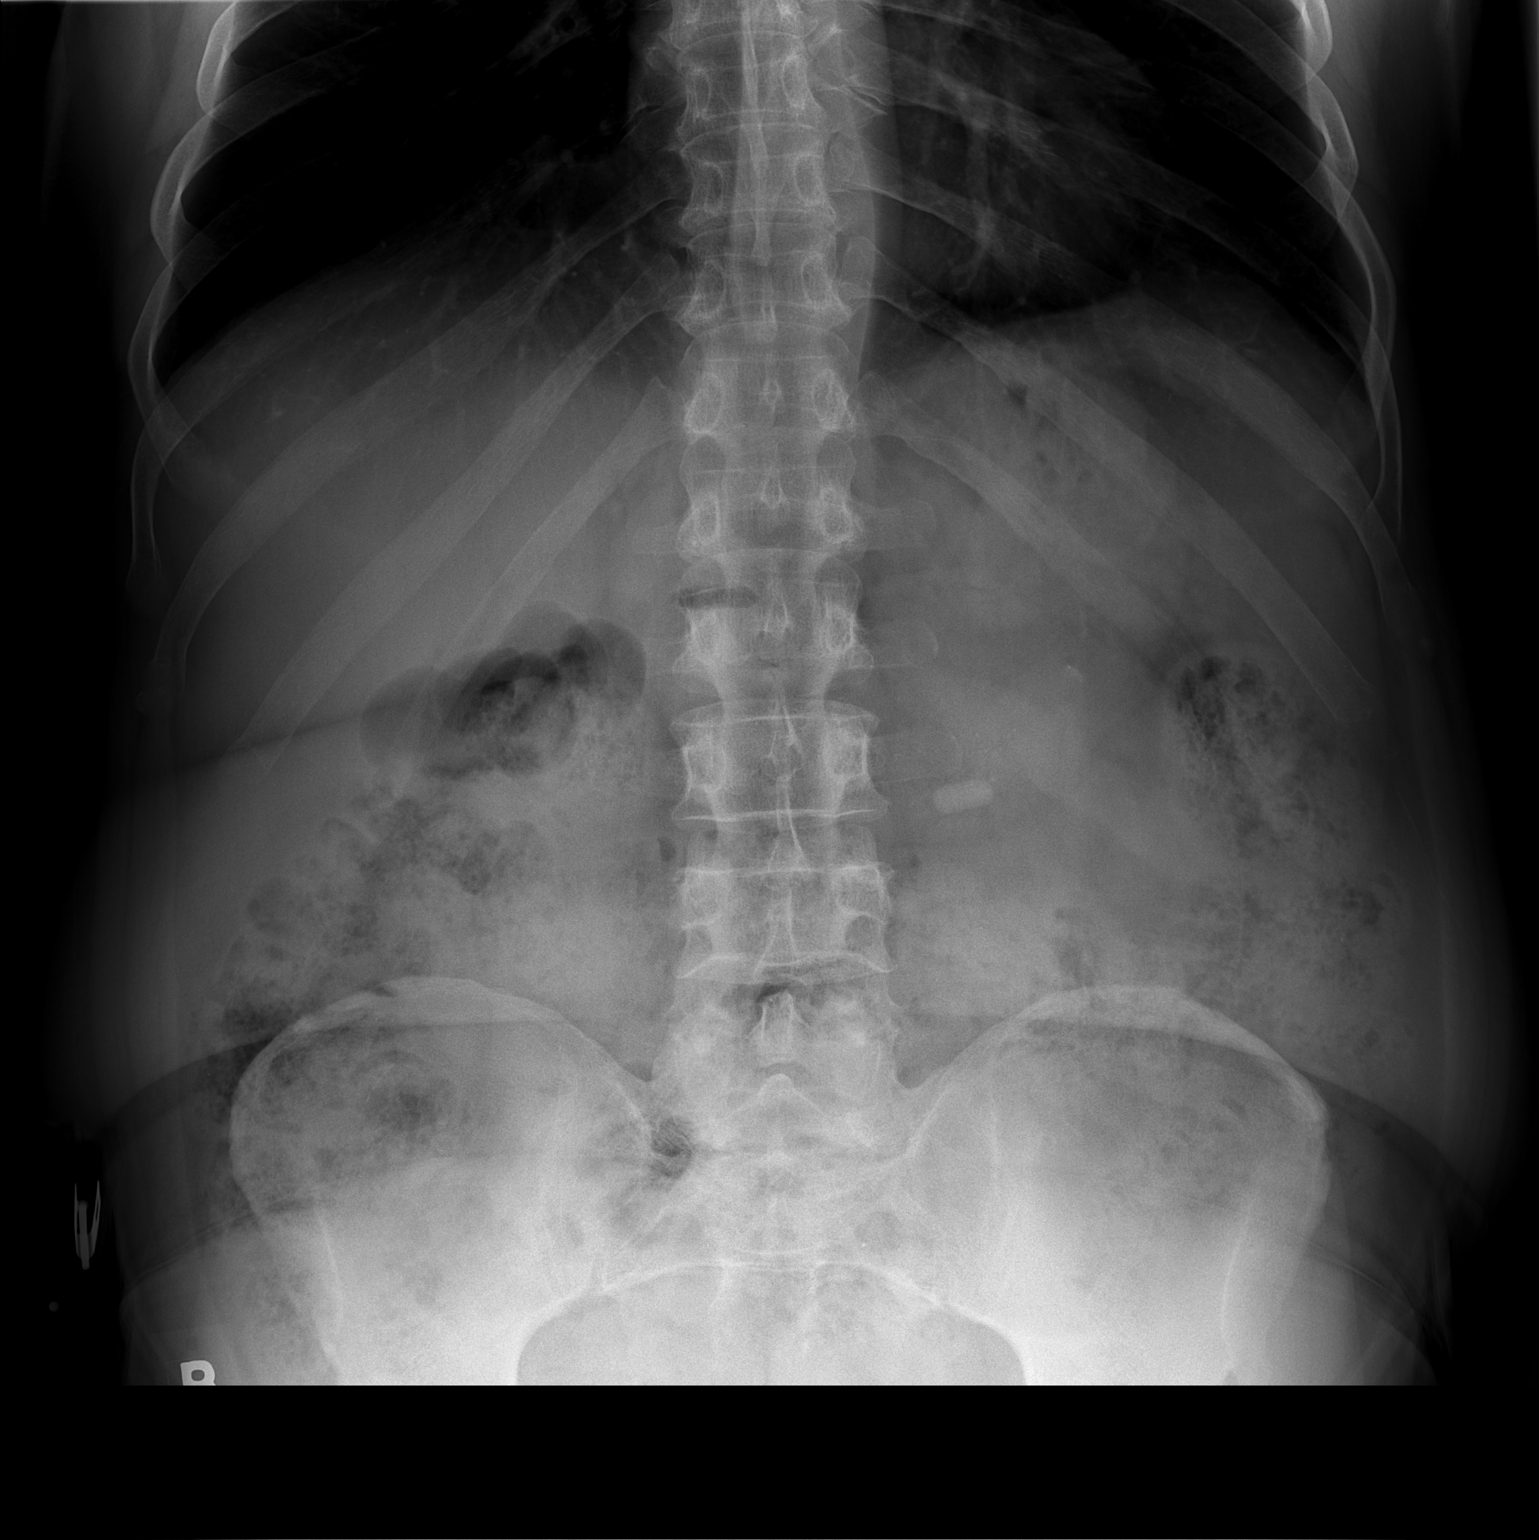

[2 of 2 positions shown; findings below may reference images not displayed]

FINDINGS: Large stool burden throughout the colon. There is normal bowel gas
pattern. No free air. No organomegaly or suspicious calcification.
No acute bony abnormality.
IMPRESSION: Large stool burden.  No acute findings.

## 2022-11-30 ENCOUNTER — Encounter (HOSPITAL_COMMUNITY): Payer: Self-pay | Admitting: Psychiatry

## 2022-11-30 ENCOUNTER — Telehealth (HOSPITAL_BASED_OUTPATIENT_CLINIC_OR_DEPARTMENT_OTHER): Payer: PPO | Admitting: Psychiatry

## 2022-11-30 VITALS — Wt 209.0 lb

## 2022-11-30 DIAGNOSIS — F419 Anxiety disorder, unspecified: Secondary | ICD-10-CM | POA: Diagnosis not present

## 2022-11-30 DIAGNOSIS — F251 Schizoaffective disorder, depressive type: Secondary | ICD-10-CM

## 2022-11-30 MED ORDER — ARIPIPRAZOLE 10 MG PO TABS: 10.0000 mg | ORAL_TABLET | Freq: Every day | ORAL | 0 refills | Status: DC

## 2022-11-30 NOTE — Progress Notes (Signed)
[facility] MD Virtual Progress Note   Patient Location: Home Provider Location: Home Office  I connect with patient by video and verified that I am speaking with correct person by using two identifiers. I discussed the limitations of evaluation and management by telemedicine and the availability of in person appointments. I also discussed with the patient that there may be a patient responsible charge related to this service. The patient expressed understanding and agreed to proceed.  Randall Mccann [id number] 57 y.o.  11/30/2022 3:45 PM  History of Present Illness:  Patient is evaluated by video session.  He is doing well on his current medication.  Recently had a blood work and his hemoglobin A1c further dropped to 5.6.  He also lost weight.  He liked the Ozempic which is keeping him weight under control and blood sugar.  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  He sleeps good.  Occasionally has back pain but stable.  He may need injection in his back if needed in the future.  Denies any paranoia, hallucination, irritability, crying spells or any feeling of hopelessness or worthlessness.  He does go outside when weather is permit as lately very hot.  He does not want to change the medication.  He has no tremor or shakes or any EPS.  Like to continue Abilify.  Past Psychiatric History: H/O irritability, anger, paranoia and trust issues since school age.  One inpatient as teenager and then twice in 1998.  No h/o suicidal attempt.  Tried lithium, Haldol, Risperdal and Paxil but no recall. H/O using drugs and alcohol.      Outpatient Encounter Medications as of 11/30/2022  Medication Sig   doxycycline (VIBRAMYCIN) 50 MG capsule Take 50 mg by mouth 2 (two) times daily.   modafinil (PROVIGIL) 200 MG tablet Take 200 mg by mouth 2 (two) times daily.   pantoprazole (PROTONIX) 40 MG tablet Take 40 mg by mouth daily.   spironolactone (ALDACTONE) 25 MG tablet  Take 1 tablet by mouth daily.   ARIPiprazole (ABILIFY) 10 MG tablet Take 1 tablet (10 mg total) by mouth daily.   atorvastatin (LIPITOR) 40 MG tablet Take 40 mg by mouth daily.   cloNIDine (CATAPRES) 0.1 MG tablet Take 0.1 mg by mouth 2 (two) times daily.   lisinopril (ZESTRIL) 40 MG tablet Take 40 mg by mouth daily.   metroNIDAZOLE (METROGEL) 1 % gel Apply topically daily.   Omega-3 Fatty Acids (FISH OIL) 1000 MG CAPS Take by mouth.   OZEMPIC, 0.25 OR 0.5 MG/DOSE, 2 MG/1.5ML SOPN Inject 0.5 mg into the skin once a week.   pregabalin (LYRICA) 25 MG capsule Take 25 mg by mouth 2 (two) times daily.   triamcinolone ointment (KENALOG) 0.1 %    [DISCONTINUED] ARIPiprazole (ABILIFY) 10 MG tablet Take 1 tablet (10 mg total) by mouth daily.   [DISCONTINUED] doxycycline (PERIOSTAT) 20 MG tablet Take 20 mg by mouth 2 (two) times daily with a meal. (Patient not taking: Reported on 06/15/2021)   [DISCONTINUED] Hyaluronate Sodium (BIONECT) 0.2 % CREA Apply topically.   [DISCONTINUED] lisinopril-hydrochlorothiazide (ZESTORETIC) 20-25 MG tablet Take 2 tablets by mouth daily.   [DISCONTINUED] modafinil (PROVIGIL) 200 MG tablet    [DISCONTINUED] omeprazole (PRILOSEC) 40 MG capsule    [DISCONTINUED] spironolactone (ALDACTONE) 25 MG tablet Take 25 mg by mouth daily.   No facility-administered encounter medications on file as of 11/30/2022.    No results found for this or any previous visit (from the past  2160 hour(s)).   Psychiatric Specialty Exam: Physical Exam  Review of Systems  Weight 209 lb (94.8 kg).There is no height or weight on file to calculate BMI.  General Appearance: Casual  Eye Contact:  Good  Speech:  Clear and Coherent  Volume:  Normal  Mood:  Euthymic  Affect:  Appropriate  Thought Process:  Goal Directed  Orientation:  Full (Time, Place, and Person)  Thought Content:  Logical  Suicidal Thoughts:  No  Homicidal Thoughts:  No  Memory:  Immediate;   Good Recent;   Good Remote;    Good  Judgement:  Good  Insight:  Present  Psychomotor Activity:  Normal  Concentration:  Concentration: Good and Attention Span: Good  Recall:  Good  Fund of Knowledge:  Good  Language:  Good  Akathisia:  No  Handed:  Right  AIMS (if indicated):     Assets:  Communication Skills Desire for Improvement Housing Resilience Transportation  ADL's:  Intact  Cognition:  WNL  Sleep:  ok     Assessment/Plan: Schizoaffective disorder, depressive type (HCC) - Plan: ARIPiprazole (ABILIFY) 10 MG tablet  Anxiety - Plan: ARIPiprazole (ABILIFY) 10 MG tablet  I reviewed blood work results from his primary care.  His hemoglobin A1c further reduce and he lost 10 pounds since the last visit.  He takes Provigil, melatonin, Lyrica from primary care.  Overall things are going very well for him.  He does not want to change the medication since it is helping the paranoia and anxiety.  Continue Abilify 10 mg daily.  Recommend to call us back if is any question or any concern.  Follow-up in 3 months.   Follow Up Instructions:     I discussed the assessment and treatment plan with the patient. The patient was provided an opportunity to ask questions and all were answered. The patient agreed with the plan and demonstrated an understanding of the instructions.   The patient was advised to call back or seek an in-person evaluation if the symptoms worsen or if the condition fails to improve as anticipated.    Collaboration of Care: Other provider involved in patient's care AEB notes are available in epic to review  Patient/Guardian was advised Release of Information must be obtained prior to any record release in order to collaborate their care with an outside provider. Patient/Guardian was advised if they have not already done so to contact the registration department to sign all necessary forms in order for [place] to release information regarding their care.   Consent: Patient/Guardian gives verbal consent  for treatment and assignment of benefits for services provided during this visit. Patient/Guardian expressed understanding and agreed to proceed.     I provided 15 minutes of non face to face time during this encounter.  Note: This document was prepared by [employer] voice dictation technology and any errors that results from this process are unintentional.    Cleotis Nipper, MD 11/30/2022

## 2023-03-01 ENCOUNTER — Telehealth (HOSPITAL_COMMUNITY): Payer: PPO | Admitting: Psychiatry

## 2023-03-10 ENCOUNTER — Other Ambulatory Visit: Payer: Self-pay | Admitting: Medical Genetics

## 2023-03-10 DIAGNOSIS — Z006 Encounter for examination for normal comparison and control in clinical research program: Secondary | ICD-10-CM

## 2023-03-14 ENCOUNTER — Telehealth (HOSPITAL_BASED_OUTPATIENT_CLINIC_OR_DEPARTMENT_OTHER): Payer: PPO | Admitting: Psychiatry

## 2023-03-14 ENCOUNTER — Encounter (HOSPITAL_COMMUNITY): Payer: Self-pay | Admitting: Psychiatry

## 2023-03-14 VITALS — Wt 216.0 lb

## 2023-03-14 DIAGNOSIS — F251 Schizoaffective disorder, depressive type: Secondary | ICD-10-CM | POA: Diagnosis not present

## 2023-03-14 DIAGNOSIS — F419 Anxiety disorder, unspecified: Secondary | ICD-10-CM

## 2023-03-14 MED ORDER — ARIPIPRAZOLE 10 MG PO TABS: 10.0000 mg | ORAL_TABLET | Freq: Every day | ORAL | 0 refills | Status: DC

## 2023-03-14 NOTE — Progress Notes (Signed)
[facility] MD Virtual Progress Note   Patient Location: Home Provider Location: Home Office  I connect with patient by video and verified that I am speaking with correct person by using two identifiers. I discussed the limitations of evaluation and management by telemedicine and the availability of in person appointments. I also discussed with the patient that there may be a patient responsible charge related to this service. The patient expressed understanding and agreed to proceed.  Randall Mccann [id number] 57 y.o.  03/14/2023 3:02 PM  History of Present Illness:  Patient is evaluated by video session.  He is taking Abilify and denies any side effects, mania, psychosis or any hallucination.  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  He reported his back pain is bothering and now he is thinking about getting ablation.  He admitted sometimes sleep is not good and does not feel the modafinil is working as it used to be.  He uses CPAP.  He has appointment coming up with a sleep doctor to address this issue.  Denies any agitation, anger, mania, psychosis.  He denies any hallucination or any paranoia.  His appetite is okay.  He like to continue Abilify because keeping his mood stable.  Denies any side effects.  Past Psychiatric History: H/O irritability, anger, paranoia and trust issues since school age.  One inpatient as teenager and then twice in 1998.  No h/o suicidal attempt.  Tried lithium, Haldol, Risperdal and Paxil but no recall. H/O using drugs and alcohol.    Outpatient Encounter Medications as of 03/14/2023  Medication Sig   ARIPiprazole (ABILIFY) 10 MG tablet Take 1 tablet (10 mg total) by mouth daily.   atorvastatin (LIPITOR) 40 MG tablet Take 40 mg by mouth daily.   cloNIDine (CATAPRES) 0.1 MG tablet Take 0.1 mg by mouth 2 (two) times daily.   doxycycline (VIBRAMYCIN) 50 MG capsule Take 50 mg by mouth 2 (two) times daily.   lisinopril (ZESTRIL)  40 MG tablet Take 40 mg by mouth daily.   metroNIDAZOLE (METROGEL) 1 % gel Apply topically daily.   modafinil (PROVIGIL) 200 MG tablet Take 200 mg by mouth 2 (two) times daily.   Omega-3 Fatty Acids (FISH OIL) 1000 MG CAPS Take by mouth.   OZEMPIC, 0.25 OR 0.5 MG/DOSE, 2 MG/1.5ML SOPN Inject 0.5 mg into the skin once a week.   pantoprazole (PROTONIX) 40 MG tablet Take 40 mg by mouth daily.   pregabalin (LYRICA) 25 MG capsule Take 25 mg by mouth 2 (two) times daily.   spironolactone (ALDACTONE) 25 MG tablet Take 1 tablet by mouth daily.   triamcinolone ointment (KENALOG) 0.1 %    No facility-administered encounter medications on file as of 03/14/2023.    No results found for this or any previous visit (from the past 2160 hour(s)).   Psychiatric Specialty Exam: Physical Exam  Review of Systems  Musculoskeletal:  Positive for back pain.    Weight 216 lb (98 kg).There is no height or weight on file to calculate BMI.  General Appearance: Casual  Eye Contact:  Fair  Speech:  Normal Rate  Volume:  Normal  Mood:  Euthymic  Affect:  Appropriate  Thought Process:  Goal Directed  Orientation:  Full (Time, Place, and Person)  Thought Content:  WDL  Suicidal Thoughts:  No  Homicidal Thoughts:  No  Memory:  Immediate;   Good Recent;   Good Remote;   Good  Judgement:  Good  Insight:  Present  Psychomotor Activity:  Normal  Concentration:  Concentration: Good and Attention Span: Good  Recall:  Good  Fund of Knowledge:  Good  Language:  Good  Akathisia:  No  Handed:  Right  AIMS (if indicated):     Assets:  Communication Skills Desire for Improvement Housing Resilience Transportation  ADL's:  Intact  Cognition:  WNL  Sleep:  fair, uses CPAP     Assessment/Plan: Schizoaffective disorder, depressive type (HCC) - Plan: ARIPiprazole (ABILIFY) 10 MG tablet  Anxiety - Plan: ARIPiprazole (ABILIFY) 10 MG tablet  Patient is stable on his current medication.  Continue Abilify 10 mg  daily.  He is taking Provigil, melatonin, Lyrica from primary care.  Recommended to call us back with any question or any concern.  Follow-up in 3 months.  He will talk to his sleep provider to address his sleep issue and he is also taking to get ablation for his back pain.   Follow Up Instructions:     I discussed the assessment and treatment plan with the patient. The patient was provided an opportunity to ask questions and all were answered. The patient agreed with the plan and demonstrated an understanding of the instructions.   The patient was advised to call back or seek an in-person evaluation if the symptoms worsen or if the condition fails to improve as anticipated.    Collaboration of Care: Other provider involved in patient's care AEB notes are available in epic to review  Patient/Guardian was advised Release of Information must be obtained prior to any record release in order to collaborate their care with an outside provider. Patient/Guardian was advised if they have not already done so to contact the registration department to sign all necessary forms in order for [place] to release information regarding their care.   Consent: Patient/Guardian gives verbal consent for treatment and assignment of benefits for services provided during this visit. Patient/Guardian expressed understanding and agreed to proceed.     I provided 22 minutes of non face to face time during this encounter.  Note: This document was prepared by [employer] voice dictation technology and any errors that results from this process are unintentional.    Cleotis Nipper, MD 03/14/2023

## 2023-06-13 ENCOUNTER — Telehealth (HOSPITAL_COMMUNITY): Payer: PPO | Admitting: Psychiatry

## 2023-06-23 ENCOUNTER — Telehealth (HOSPITAL_COMMUNITY): Payer: PPO | Admitting: Psychiatry

## 2023-06-23 ENCOUNTER — Encounter (HOSPITAL_COMMUNITY): Payer: Self-pay | Admitting: Psychiatry

## 2023-06-23 VITALS — Wt 224.0 lb

## 2023-06-23 DIAGNOSIS — F419 Anxiety disorder, unspecified: Secondary | ICD-10-CM | POA: Diagnosis not present

## 2023-06-23 DIAGNOSIS — F251 Schizoaffective disorder, depressive type: Secondary | ICD-10-CM

## 2023-06-23 MED ORDER — ARIPIPRAZOLE 10 MG PO TABS: 10.0000 mg | ORAL_TABLET | Freq: Every day | ORAL | 0 refills | Status: DC

## 2023-06-23 NOTE — Progress Notes (Signed)
[facility] MD Virtual Progress Note   Patient Location: Home Provider Location: Office  I connect with patient by video and verified that I am speaking with correct person by using two identifiers. I discussed the limitations of evaluation and management by telemedicine and the availability of in person appointments. I also discussed with the patient that there may be a patient responsible charge related to this service. The patient expressed understanding and agreed to proceed.  Randall Mccann [id number] 58 y.o.  06/23/2023 3:14 PM  History of Present Illness:  Patient is evaluated by video session.  He is taking Abilify and reported no major concern.  He does not go outside as such because of cold weather.  He has a visit with his sleep doctor and now he is no longer taking modafinil but started taking Adderall 20 mg in the morning and 10 mg at noon.  Patient told it is helping his energy level and he noticed sleep is much better and improved.  He does not take frequent daily naps.  He is also using CPAP at bedtime.  Patient denies any agitation, anger, mania, psychosis.  He denies any hallucination or any suicidal thoughts.  He admitted weight gain in past few months but hoping once brother get better he may able to go outside.  He denies drinking or using any illegal substances.  He has no tremor or shakes or any EPS.  Past Psychiatric History: H/O irritability, anger, paranoia and trust issues since school age.  One inpatient as teenager and then twice in 1998.  No h/o suicidal attempt.  Tried lithium, Haldol, Risperdal and Paxil but no recall. H/O using drugs and alcohol.    Outpatient Encounter Medications as of 06/23/2023  Medication Sig   ARIPiprazole (ABILIFY) 10 MG tablet Take 1 tablet (10 mg total) by mouth daily.   atorvastatin (LIPITOR) 40 MG tablet Take 40 mg by mouth daily.   cloNIDine (CATAPRES) 0.1 MG tablet Take 0.1 mg by mouth 2 (two) times daily.    doxycycline (VIBRAMYCIN) 50 MG capsule Take 50 mg by mouth 2 (two) times daily.   lisinopril (ZESTRIL) 40 MG tablet Take 40 mg by mouth daily.   metroNIDAZOLE (METROGEL) 1 % gel Apply topically daily.   modafinil (PROVIGIL) 200 MG tablet Take 200 mg by mouth 2 (two) times daily.   Omega-3 Fatty Acids (FISH OIL) 1000 MG CAPS Take by mouth.   OZEMPIC, 0.25 OR 0.5 MG/DOSE, 2 MG/1.5ML SOPN Inject 0.5 mg into the skin once a week.   pantoprazole (PROTONIX) 40 MG tablet Take 40 mg by mouth daily.   pregabalin (LYRICA) 25 MG capsule Take 25 mg by mouth 2 (two) times daily.   spironolactone (ALDACTONE) 25 MG tablet Take 1 tablet by mouth daily.   triamcinolone ointment (KENALOG) 0.1 %    No facility-administered encounter medications on file as of 06/23/2023.    No results found for this or any previous visit (from the past 2160 hours).   Psychiatric Specialty Exam: Physical Exam  Review of Systems  Weight 224 lb (101.6 kg).There is no height or weight on file to calculate BMI.  General Appearance: Casual  Eye Contact:  Fair  Speech:  Slow  Volume:  Normal  Mood:  Euthymic  Affect:  Congruent  Thought Process:  Goal Directed  Orientation:  Full (Time, Place, and Person)  Thought Content:  Logical  Suicidal Thoughts:  No  Homicidal Thoughts:  No  Memory:  Immediate;   Good  Recent;   Good Remote;   Fair  Judgement:  Fair  Insight:  Present  Psychomotor Activity:  Normal  Concentration:  Concentration: Good and Attention Span: Good  Recall:  Good  Fund of Knowledge:  Good  Language:  Good  Akathisia:  No  Handed:  Right  AIMS (if indicated):     Assets:  Communication Skills Desire for Improvement Housing Resilience Transportation  ADL's:  Intact  Cognition:  WNL  Sleep:  ok with CPAP     Assessment/Plan: Schizoaffective disorder, depressive type (HCC) - Plan: ARIPiprazole (ABILIFY) 10 MG tablet  Anxiety - Plan: ARIPiprazole (ABILIFY) 10 MG tablet  Patient paranoia and  anxiety is stable on Abilify 10 mg daily.  He is no longer taking Provigil and his sleep doctor started him on Adderall 20 mg in the morning and 10 mg at noon.  I explained that he need to watch his symptoms carefully as stimulant can cause worsening of anxiety.  Her he is sleeping better with the medication.  Discussed medication side effects and benefits.  Recommended to call us back if is any question or any concern.  He is hoping to start walking and exercise once brother get better.  We will follow-up in 3 months.   Follow Up Instructions:     I discussed the assessment and treatment plan with the patient. The patient was provided an opportunity to ask questions and all were answered. The patient agreed with the plan and demonstrated an understanding of the instructions.   The patient was advised to call back or seek an in-person evaluation if the symptoms worsen or if the condition fails to improve as anticipated.    Collaboration of Care: Other provider involved in patient's care AEB notes are available in epic to review  Patient/Guardian was advised Release of Information must be obtained prior to any record release in order to collaborate their care with an outside provider. Patient/Guardian was advised if they have not already done so to contact the registration department to sign all necessary forms in order for [place] to release information regarding their care.   Consent: Patient/Guardian gives verbal consent for treatment and assignment of benefits for services provided during this visit. Patient/Guardian expressed understanding and agreed to proceed.     I provided 18 minutes of non face to face time during this encounter.  Note: This document was prepared by [employer] voice dictation technology and any errors that results from this process are unintentional.    Cleotis Nipper, MD 06/23/2023

## 2023-09-05 LAB — LAB REPORT - SCANNED
A1c: 6
Calcium: 9.9
EGFR: 82

## 2023-09-08 ENCOUNTER — Telehealth (HOSPITAL_COMMUNITY): Payer: Self-pay | Admitting: *Deleted

## 2023-09-08 NOTE — Telephone Encounter (Signed)
 Lab results from 09/05/23 received from [facility]. CMP: Glucose High 108 Cholesterol, HDL 41 HgbA1c 6.0  Otherwisw WNL

## 2023-09-21 ENCOUNTER — Encounter (HOSPITAL_COMMUNITY): Payer: Self-pay | Admitting: Psychiatry

## 2023-09-21 ENCOUNTER — Telehealth (HOSPITAL_BASED_OUTPATIENT_CLINIC_OR_DEPARTMENT_OTHER): Admitting: Psychiatry

## 2023-09-21 DIAGNOSIS — F251 Schizoaffective disorder, depressive type: Secondary | ICD-10-CM | POA: Diagnosis not present

## 2023-09-21 DIAGNOSIS — F419 Anxiety disorder, unspecified: Secondary | ICD-10-CM | POA: Diagnosis not present

## 2023-09-21 MED ORDER — ARIPIPRAZOLE 10 MG PO TABS: 10.0000 mg | ORAL_TABLET | Freq: Every day | ORAL | 0 refills | Status: DC

## 2023-09-21 NOTE — Progress Notes (Signed)
 [facility] MD Virtual Progress Note   Patient Location: Home Provider Location: Office  I connect with patient by video and verified that I am speaking with correct person by using two identifiers. I discussed the limitations of evaluation and management by telemedicine and the availability of in person appointments. I also discussed with the patient that there may be a patient responsible charge related to this service. The patient expressed understanding and agreed to proceed.  Randall Mccann [id number] 58 y.o.  09/21/2023 11:05 AM  History of Present Illness:  Patient is evaluated by video session.  He reported lately irritability because of heating of the house not working and he is upset.  He is hoping it should resolve in few days.  He also reported one of his close 50 year old friend died but she was having a lot of health issues.  Recently he had a visit with his physician who change his medicine back to modafinil and he is no longer taking Adderall.  Patient told it was making him more anxious.  He is taking Ozempic and lost few more pounds since the last visit.  He is sleeping okay with the help of CPAP.  He is taking Abilify  which is keeping his mood stable and he denies any hallucination, paranoia, suicidal thoughts or homicidal thoughts.  He does not take frequent naps but also does not have a lot of motivation to go outside because of his back pain.  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  He has back pain and he is scheduled to get injection end of this month.  Denies drinking or using any illegal substances.  He has no tremor or shakes or any EPS.  Recently labs shows his hemoglobin A1c stable at 6.  Past Psychiatric History: H/O irritability, anger, paranoia and trust issues since school age.  One inpatient as teenager and then twice in 1998.  No h/o suicidal attempt.  Tried lithium, Haldol, Risperdal and Paxil but no recall. H/O  using drugs and alcohol .    Outpatient Encounter Medications as of 09/21/2023  Medication Sig   ADDERALL XR 20 MG 24 hr capsule Take 1 capsule by mouth every morning.   amphetamine-dextroamphetamine (ADDERALL) 10 MG tablet Take 10 mg by mouth daily.   ARIPiprazole  (ABILIFY ) 10 MG tablet Take 1 tablet (10 mg total) by mouth daily.   atorvastatin (LIPITOR) 40 MG tablet Take 40 mg by mouth daily.   cloNIDine (CATAPRES) 0.1 MG tablet Take 0.1 mg by mouth 2 (two) times daily.   doxycycline (VIBRAMYCIN) 50 MG capsule Take 50 mg by mouth 2 (two) times daily.   lisinopril (ZESTRIL) 40 MG tablet Take 40 mg by mouth daily.   metroNIDAZOLE (METROGEL) 1 % gel Apply topically daily.   Omega-3 Fatty Acids (FISH OIL) 1000 MG CAPS Take by mouth.   OZEMPIC, 0.25 OR 0.5 MG/DOSE, 2 MG/1.5ML SOPN Inject 0.5 mg into the skin once a week.   pantoprazole (PROTONIX) 40 MG tablet Take 40 mg by mouth daily.   pregabalin (LYRICA) 25 MG capsule Take 25 mg by mouth 2 (two) times daily.   spironolactone (ALDACTONE) 25 MG tablet Take 1 tablet by mouth daily.   triamcinolone  ointment (KENALOG ) 0.1 %    No facility-administered encounter medications on file as of 09/21/2023.    No results found for this or any previous visit (from the past 2160 hours).   Psychiatric Specialty Exam: Physical Exam  Review of  Systems  Weight 217 lb (98.4 kg).There is no height or weight on file to calculate BMI.  General Appearance: Casual  Eye Contact:  Good  Speech:  Clear and Coherent and Normal Rate  Volume:  Normal  Mood:  Dysphoric  Affect:  Appropriate  Thought Process:  Goal Directed  Orientation:  Full (Time, Place, and Person)  Thought Content:  WDL  Suicidal Thoughts:  No  Homicidal Thoughts:  No  Memory:  Immediate;   Good Recent;   Good Remote;   Good  Judgement:  Good  Insight:  Good  Psychomotor Activity:  Normal  Concentration:  Concentration: Good and Attention Span: Good  Recall:  Good  Fund of  Knowledge:  Good  Language:  Good  Akathisia:  No  Handed:  Right  AIMS (if indicated):     Assets:  Communication Skills Desire for Improvement Housing Resilience Transportation  ADL's:  Intact  Cognition:  WNL  Sleep:  ok with CPAP       09/21/2023   11:17 AM  Depression screen PHQ 2/9  Decreased Interest 0  Down, Depressed, Hopeless 0  PHQ - 2 Score 0    Assessment/Plan: Schizoaffective disorder, depressive type (HCC) - Plan: ARIPiprazole  (ABILIFY ) 10 MG tablet  Anxiety - Plan: ARIPiprazole  (ABILIFY ) 10 MG tablet  Patient is stable on his medication however has some irritability due to heating issue of the home.  I also offered therapy since his close 63 year old friend died but patient told she was not doing very well and it is not expected that she will live long.  Patient is handling the loss okay and does not feel he need to see a therapist.  Patient taking modafinil and Adderall discontinued.  He is having upcoming appointment and not sure if the daughter will continue the modafinil or change to something else.  He like to keep the Abilify  10 mg which is keeping his mood stable.  He denies any panic attack.  Recommend to call us  back if is any question or any concern.  Follow-up in 3 months.  I reviewed blood work results.  Hemoglobin A1c remains stable at 6.   Follow Up Instructions:     I discussed the assessment and treatment plan with the patient. The patient was provided an opportunity to ask questions and all were answered. The patient agreed with the plan and demonstrated an understanding of the instructions.   The patient was advised to call back or seek an in-person evaluation if the symptoms worsen or if the condition fails to improve as anticipated.    Collaboration of Care: Other provider involved in patient's care AEB notes are available in epic to review  Patient/Guardian was advised Release of Information must be obtained prior to any record release in  order to collaborate their care with an outside provider. Patient/Guardian was advised if they have not already done so to contact the registration department to sign all necessary forms in order for us  to release information regarding their care.   Consent: Patient/Guardian gives verbal consent for treatment and assignment of benefits for services provided during this visit. Patient/Guardian expressed understanding and agreed to proceed.     Total encounter time 16 minutes which includes face-to-face time, chart reviewed, care coordination, order entry and documentation during this encounter.   Note: This document was prepared by [employer] voice dictation technology and any errors that results from this process are unintentional.    Arturo Late, MD 09/21/2023

## 2023-09-22 ENCOUNTER — Telehealth (HOSPITAL_COMMUNITY): Payer: PPO | Admitting: Psychiatry

## 2023-12-26 ENCOUNTER — Telehealth (HOSPITAL_COMMUNITY): Admitting: Psychiatry

## 2023-12-26 ENCOUNTER — Encounter (HOSPITAL_COMMUNITY): Payer: Self-pay | Admitting: Psychiatry

## 2023-12-26 ENCOUNTER — Telehealth (HOSPITAL_BASED_OUTPATIENT_CLINIC_OR_DEPARTMENT_OTHER): Admitting: Psychiatry

## 2023-12-26 DIAGNOSIS — F419 Anxiety disorder, unspecified: Secondary | ICD-10-CM

## 2023-12-26 DIAGNOSIS — F251 Schizoaffective disorder, depressive type: Secondary | ICD-10-CM

## 2023-12-26 MED ORDER — ARIPIPRAZOLE 10 MG PO TABS
10.0000 mg | ORAL_TABLET | Freq: Every day | ORAL | 0 refills | Status: DC
Start: 2023-12-26 — End: 2024-03-26

## 2023-12-26 NOTE — Progress Notes (Signed)
 [facility] MD Virtual Progress Note   Patient Location: Home Provider Location: Home Office  I connect with patient by video and verified that I am speaking with correct person by using two identifiers. I discussed the limitations of evaluation and management by telemedicine and the availability of in person appointments. I also discussed with the patient that there may be a patient responsible charge related to this service. The patient expressed understanding and agreed to proceed.  Randall Mccann [id number] 58 y.o.  12/26/2023 10:23 AM  History of Present Illness:  Patient is evaluated by video session.  He reported things are going okay.  He recently had a visit with his sleep doctor and his Anafranil is changed to armodafinil and he like it.  He is not as tired.  He does not have to take too much caffeine to wake up.  Denies any irritability, anger, mania, psychosis.  He denies any hallucination.  He has chronic insomnia and hypersomnia and he is getting treatment at sleep center.  He does not want to change the medication since it is working well.  He lost 2 pounds since the last visit.  His appetite is okay.  He like to keep his current medication which is keeping his mood symptoms and anxiety under control.  Due to extreme hot weather he does not go outside however mom rather is better usually walk around.  Denies drinking or using any illegal substances.  Past Psychiatric History: H/O irritability, anger, paranoia and trust issues since school age.  One inpatient as teenager and then twice in 1998.  No h/o suicidal attempt.  Tried lithium, Haldol, Risperdal and Paxil but no recall. H/O using drugs and alcohol .   Past Medical History:  Diagnosis Date   Essential hypertension    Hyperlipidemia    Idiopathic hypersomnia    Low back pain    Obstructive sleep apnea    Uses CPAP machine nightly   PTSD (post-traumatic stress disorder), in remission    Said to have  occurred many years prior [place]   Schizoaffective disorder, depressive type     Outpatient Encounter Medications as of 12/26/2023  Medication Sig   Armodafinil 250 MG tablet Take 250 mg by mouth every morning.   ARIPiprazole  (ABILIFY ) 10 MG tablet Take 1 tablet (10 mg total) by mouth daily.   atorvastatin (LIPITOR) 40 MG tablet Take 40 mg by mouth daily.   cloNIDine (CATAPRES) 0.1 MG tablet Take 0.1 mg by mouth 2 (two) times daily.   doxycycline (VIBRAMYCIN) 50 MG capsule Take 50 mg by mouth 2 (two) times daily.   lisinopril (ZESTRIL) 40 MG tablet Take 40 mg by mouth daily.   melatonin 5 MG TABS Take 5 mg by mouth at bedtime.   metroNIDAZOLE (METROGEL) 1 % gel Apply topically daily.   Omega-3 Fatty Acids (FISH OIL) 1000 MG CAPS Take by mouth.   OZEMPIC, 1 MG/DOSE, 4 MG/3ML SOPN Inject 1 mg into the skin once a week.   pantoprazole (PROTONIX) 40 MG tablet Take 40 mg by mouth daily.   pregabalin (LYRICA) 25 MG capsule Take 25 mg by mouth 2 (two) times daily.   spironolactone (ALDACTONE) 25 MG tablet Take 1 tablet by mouth daily.   triamcinolone  ointment (KENALOG ) 0.1 %    [DISCONTINUED] ARIPiprazole  (ABILIFY ) 10 MG tablet Take 1 tablet (10 mg total) by mouth daily.   [DISCONTINUED] modafinil (PROVIGIL) 200 MG tablet Take 200 mg by mouth daily.   No facility-administered encounter  medications on file as of 12/26/2023.    No results found for this or any previous visit (from the past 2160 hours).   Psychiatric Specialty Exam: Physical Exam  Review of Systems  There were no vitals taken for this visit.There is no height or weight on file to calculate BMI.  General Appearance: Casual  Eye Contact:  Good  Speech:  Clear and Coherent  Volume:  Normal  Mood:  Euthymic  Affect:  Appropriate  Thought Process:  Goal Directed  Orientation:  Full (Time, Place, and Person)  Thought Content:  WDL  Suicidal Thoughts:  No  Homicidal Thoughts:  No  Memory:  Immediate;    Good Recent;   Good Remote;   Good  Judgement:  Good  Insight:  Present  Psychomotor Activity:  Normal  Concentration:  Concentration: Good and Attention Span: Good  Recall:  Good  Fund of Knowledge:  Good  Language:  Good  Akathisia:  No  Handed:  Right  AIMS (if indicated):     Assets:  Communication Skills Desire for Improvement Housing Resilience Transportation  ADL's:  Intact  Cognition:  WNL  Sleep:  fair       09/21/2023   11:17 AM  Depression screen PHQ 2/9  Decreased Interest 0  Down, Depressed, Hopeless 0  PHQ - 2 Score 0    Assessment/Plan: Schizoaffective disorder, depressive type (HCC) - Plan: ARIPiprazole  (ABILIFY ) 10 MG tablet  Anxiety - Plan: ARIPiprazole  (ABILIFY ) 10 MG tablet  Patient is stable on his current psychotropic medication.  His paranoia and anxiety is under control.  Continue Abilify  10 mg daily.  Recommend to call back if has any question or any concern.  Follow-up in 3 months.  Will ask his PCP to have labs result faxed to us .  His last hemoglobin A1c was 6.   Follow Up Instructions:     I discussed the assessment and treatment plan with the patient. The patient was provided an opportunity to ask questions and all were answered. The patient agreed with the plan and demonstrated an understanding of the instructions.   The patient was advised to call back or seek an in-person evaluation if the symptoms worsen or if the condition fails to improve as anticipated.    Collaboration of Care: Other provider involved in patient's care AEB notes are available in epic to review  Patient/Guardian was advised Release of Information must be obtained prior to any record release in order to collaborate their care with an outside provider. Patient/Guardian was advised if they have not already done so to contact the registration department to sign all necessary forms in order for us  to release information regarding their care.   Consent:  Patient/Guardian gives verbal consent for treatment and assignment of benefits for services provided during this visit. Patient/Guardian expressed understanding and agreed to proceed.     Total encounter time 16 minutes which includes face-to-face time, chart reviewed, care coordination, order entry and documentation during this encounter.   Note: This document was prepared by [employer] voice dictation technology and any errors that results from this process are unintentional.    Leni ONEIDA Client, MD 12/26/2023

## 2024-03-19 NOTE — Progress Notes (Signed)
 [address] - AMBULATORY [facility]  - FAMILY MEDICINE  [address] [place] [address]  Follow up on chronic medical problem   Randall Mccann is a 58 y.o. male DOB: Dec 12, 1965   Subjective  Randall Mccann  presents for follow up visit on chronic medical problem  HPI Pt with hx of DM2 presents to office for 3 month follow up. Taking ozempic 1 mg weekly. Pt donated plasma on 02/23/24 and is now having an issue with his left arm. There is a crease in his arm that wasn't there before and he is concerned about it.   A1C 5.7 to 5.9  cmp unremarkable.   LDL 76 on lipitor.  __________________________________________________________________  Medical History[1]  Surgical History[2]  Social History[3]  Family History[4] ___________________________________________________________________  Review of Systems - all systems negative with the exception of what is listed in the HPI  Allergies[5]  Current Medications[6]  Objective   Vitals:   03/20/24 1109  BP: 102/64  Pulse: 82  SpO2: 96%  Weight: 102 kg (223 lb 14.4 oz)  Height: 1.727 m (5' 8)     Body mass index is 34.04 kg/m.    GENERAL APPEARANCE:  Well developed, well groomed. No acute distress. Color good. MENTAL STATUS: Appears alert and oriented. Affect appropriate. No manic s/s.  Mood normal.   SKIN: No suspicious lesions, masses, rashes, or ulcerations.  HEAD: Normocephalic. EYES: PERRL, EOMI. Lids w/o defect, conjunctiva and sclera appear normal.  CHEST: Respirations unlabored with normal diaphragmatic excursion. Chest wall symmetric with no masses. Breath sounds clear bilaterally w/o wheezes, rubs, rales, or rhonchi.  HEART: RRR no murmurs rubs or gallops.  MS: left arm:  prominence of left bicep vein without nodules.  No pain. No redness.  No bruising.   NEURO: Cranial nerves grossly intact.   Assessment/Plan   1. Type 2 diabetes mellitus without complication,  without long-term current use of insulin (HCC)  Hemoglobin A1C With Estimated Average Glucose    2. Primary hypertension  Comprehensive Metabolic Panel   CBC without Differential    3. Hypercholesterolemia  Lipid Panel    4. Healthcare maintenance  PSA, Total    5. Thrombophlebitis arm        Patient Instructions  A1c up to 5.9 no med changes.   Lipids at goal. No med changes Bp at goal.  Left arm vein prominence.  Setting up for vascular ultrasound to rule out superficial thrombophlebitis   Psa at next visit.   Return for Follow-up in 6 months after fasting labs.  I agree the documentation is accurate and complete.  Electronically signed by: Mickey Alberteen Hunter Mickey, PA-C 03/20/2024 11:26 AM     This document serves as a record of services personally performed by Alberteen Hunter Mickey, PA-C.  It was created on their behalf by April Colene Rumpf, CMA, a trained medical scribe, and Scientist, Forensic (CMA). During the course of documenting the history, physical exam and medical decision making, I was functioning as a stage manager. The creation of this record is the provider's dictation and/or activities during the visit.  Electronically signed by April Violet Hedge, CMA 03/19/2024 11:11 AM          [1] Past Medical History: Diagnosis Date  . Bulging lumbar disc   . Depression   . Diabetes   . HTN (hypertension)   . Obstructive sleep apnea   . Plantar fasciitis   . Schizoaffective disorder    (CMD)   .  Sleep disorder   [2] Past Surgical History: Procedure Laterality Date  . KNEE SURGERY     Procedure: KNEE SURGERY  . TONSILLECTOMY     Procedure: TONSILLECTOMY  [3] Social History Socioeconomic History  . Marital status: Single  Tobacco Use  . Smoking status: Former  . Smokeless tobacco: Never  Substance and Sexual Activity  . Alcohol  use: Yes  . Drug use: Not Currently  . Sexual activity: Yes   Social Drivers of Health   Food Insecurity: Low Risk   (03/20/2024)   Food vital sign   . Within the past 12 months, you worried that your food would run out before you got money to buy more: Never true   . Within the past 12 months, the food you bought just didn't last and you didn't have money to get more: Never true  Transportation Needs: No Transportation Needs (03/20/2024)   Transportation   . In the past 12 months, has lack of reliable transportation kept you from medical appointments, meetings, work or from getting things needed for daily living? : No  Safety: Low Risk  (03/20/2024)   Safety   . How often does anyone, including family and friends, physically hurt you?: Never   . How often does anyone, including family and friends, insult or talk down to you?: Never   . How often does anyone, including family and friends, threaten you with harm?: Never   . How often does anyone, including family and friends, scream or curse at you?: Never  Living Situation: Low Risk  (03/20/2024)   Living Situation   . What is your living situation today?: I have a steady place to live   . Think about the place you live. Do you have problems with any of the following? Choose all that apply:: None/None on this list  [4] Family History Problem Relation Name Age of Onset  . Cancer Father         prostate/kidney  . Cancer Brother         colon  . Cancer Maternal Grandfather         kidney  . Cancer Paternal Grandmother         prostate/liver  [5] Allergies Allergen Reactions  . Adhesive Rash  . Adhesive Tape-Silicones Rash  [6]  Current Outpatient Medications:  .  ARIPiprazole  (ABILIFY ) 10 mg tablet, 1 tablet, Disp: , Rfl:  .  armodafiniL (NUVIGIL) 250 mg tablet, Take 250 mg by mouth daily., Disp: , Rfl:  .  atorvastatin (LIPITOR) 40 mg tablet, Take 1 tablet by mouth once daily, Disp: 90 tablet, Rfl: 3 .  cetirizine (ZyrTEC) 10 mg tablet, Take 10 mg by mouth daily., Disp: , Rfl:  .  cloNIDine (CATAPRES) 0.1 mg tablet, Take 1 tablet by mouth  twice daily, Disp: 180 tablet, Rfl: 0 .  ferrous sulfate (IRON ORAL), Take by mouth., Disp: , Rfl:  .  ivermectin 1 % crea, APPLY A PEA-SIZED AMOUNT BY TOPICAL ROUTE ONCE DAILY TO COVER AREAS OF FACE WITH THIN LAYER AVOIDING THE EYES AND LIPS, Disp: , Rfl:  .  lisinopriL (PRINIVIL) 40 mg tablet, Take 1 tablet by mouth once daily, Disp: 90 tablet, Rfl: 0 .  melatonin 5 mg tablet, Take 5 mg by mouth daily after dinner., Disp: , Rfl:  .  multivit-min/ferrous fumarate (MULTI VITAMIN ORAL), Take by mouth., Disp: , Rfl:  .  pantoprazole (PROTONIX) 40 mg EC tablet, Take 40 mg by mouth every morning before breakfast., Disp: ,  Rfl:  .  pregabalin (LYRICA) 25 mg capsule, TAKE 1 CAPSULE BY MOUTH THREE TIMES DAILY, Disp: 90 capsule, Rfl: 1 .  psyllium husk (Metamucil Sugar-Free, aspart,) 3.4 gram/5.8 gram powd, Take by mouth. as directed, Disp: , Rfl:  .  semaglutide (Ozempic) 1 mg/dose (4 mg/3 mL) subcutaneous pen injector, INJECT 1MG   SUBCUTANEOUSLY ONCE A WEEK, Disp: 3 mL, Rfl: 3 .  spironolactone (ALDACTONE) 25 mg tablet, Take 1 tablet by mouth once daily, Disp: 90 tablet, Rfl: 3 .  doxycycline (MONODOX) 50 mg capsule, Take 50 mg by mouth daily. DERM (Patient not taking: Reported on 03/20/2024), Disp: , Rfl:

## 2024-03-26 ENCOUNTER — Encounter (HOSPITAL_COMMUNITY): Payer: Self-pay | Admitting: Psychiatry

## 2024-03-26 ENCOUNTER — Telehealth (HOSPITAL_COMMUNITY): Admitting: Psychiatry

## 2024-03-26 VITALS — Wt 223.0 lb

## 2024-03-26 DIAGNOSIS — F251 Schizoaffective disorder, depressive type: Secondary | ICD-10-CM | POA: Diagnosis not present

## 2024-03-26 DIAGNOSIS — F419 Anxiety disorder, unspecified: Secondary | ICD-10-CM | POA: Diagnosis not present

## 2024-03-26 MED ORDER — ARIPIPRAZOLE 10 MG PO TABS: 10.0000 mg | ORAL_TABLET | Freq: Every day | ORAL | 0 refills | Status: DC

## 2024-03-26 NOTE — Progress Notes (Signed)
 [facility] MD Virtual Progress Note   Patient Location: Home Provider Location: Home Office  I connect with patient by video and verified that I am speaking with correct person by using two identifiers. I discussed the limitations of evaluation and management by telemedicine and the availability of in person appointments. I also discussed with the patient that there may be a patient responsible charge related to this service. The patient expressed understanding and agreed to proceed.  Randall Mccann [id number] 58 y.o.  03/26/2024 1:11 PM  History of Present Illness:  Patient is evaluated by video session.  He reported summer was quite.  He is taking Abilify  which is helping his paranoia and anxiety.  He is taking armodafinil 250 mg in the morning which is helping his daytime sleep.  He still feels sometimes tired but overall sleep is much improved from the past.  He denies any hallucination, paranoia, suicidal thoughts.  Patient told he had an incident few weeks ago when he received a text from his friend who recently moved from [place]  to [facility] and is struggling with depression.  His friend texted him that he is going to commit suicide after his mother's birthday.  Patient admitted he was very concerned about him and he contacted suicide hotline and provided all the text messages that his friend was texting him and they were able to get him helped.  Patient is very relieved that he is doing much better and he feel more relaxed that he was to help him.  He reported Abilify  working and helping and he did not have any anger, mania, irritability, agitation.  Denies any crying spells or any hopelessness.  Denies any suicidal thoughts.  He admitted few pounds weight gain but also trying to lose weight.  Recently had a visit with his PCP.  Labs were drawn.  His hemoglobin A1c 5.9.  All other labs are normal.  He does not want to change the medication since it is working  well.  Past Psychiatric History: H/O irritability, anger, paranoia and trust issues since school age.  One inpatient as teenager and then twice in 1998.  No h/o suicidal attempt.  Tried lithium, Haldol, Risperdal and Paxil but no recall. H/O using drugs and alcohol .   Past Medical History:  Diagnosis Date   Essential hypertension    Hyperlipidemia    Idiopathic hypersomnia    Low back pain    Obstructive sleep apnea    Uses CPAP machine nightly   PTSD (post-traumatic stress disorder), in remission    Said to have occurred many years prior following [place]   Schizoaffective disorder, depressive type     Outpatient Encounter Medications as of 03/26/2024  Medication Sig   ARIPiprazole  (ABILIFY ) 10 MG tablet Take 1 tablet (10 mg total) by mouth daily.   Armodafinil 250 MG tablet Take 250 mg by mouth every morning.   atorvastatin (LIPITOR) 40 MG tablet Take 40 mg by mouth daily.   cloNIDine (CATAPRES) 0.1 MG tablet Take 0.1 mg by mouth 2 (two) times daily.   doxycycline (VIBRAMYCIN) 50 MG capsule Take 50 mg by mouth 2 (two) times daily.   lisinopril (ZESTRIL) 40 MG tablet Take 40 mg by mouth daily.   melatonin 5 MG TABS Take 5 mg by mouth at bedtime.   metroNIDAZOLE (METROGEL) 1 % gel Apply topically daily.   Omega-3 Fatty Acids (FISH OIL) 1000 MG CAPS Take by mouth.   OZEMPIC, 1 MG/DOSE, 4 MG/3ML SOPN Inject 1  mg into the skin once a week.   pantoprazole (PROTONIX) 40 MG tablet Take 40 mg by mouth daily.   pregabalin (LYRICA) 25 MG capsule Take 25 mg by mouth 2 (two) times daily.   spironolactone (ALDACTONE) 25 MG tablet Take 1 tablet by mouth daily.   triamcinolone  ointment (KENALOG ) 0.1 %    No facility-administered encounter medications on file as of 03/26/2024.    No results found for this or any previous visit (from the past 2160 hours).   Psychiatric Specialty Exam: Physical Exam  Review of Systems  Weight 223 lb (101.2 kg).There is no height or weight on  file to calculate BMI.  General Appearance: Casual  Eye Contact:  Good  Speech:  Normal Rate  Volume:  Normal  Mood:  Euthymic  Affect:  Appropriate  Thought Process:  Goal Directed  Orientation:  Full (Time, Place, and Person)  Thought Content:  WDL  Suicidal Thoughts:  No  Homicidal Thoughts:  No  Memory:  Immediate;   Good Recent;   Good Remote;   Good  Judgement:  Good  Insight:  Present  Psychomotor Activity:  Normal  Concentration:  Concentration: Good and Attention Span: Good  Recall:  Good  Fund of Knowledge:  Good  Language:  Good  Akathisia:  No  Handed:  Right  AIMS (if indicated):     Assets:  Communication Skills Desire for Improvement Housing Resilience Transportation  ADL's:  Intact  Cognition:  WNL  Sleep:  better       09/21/2023   11:17 AM  Depression screen PHQ 2/9  Decreased Interest 0  Down, Depressed, Hopeless 0  PHQ - 2 Score 0    Assessment/Plan: Schizoaffective disorder, depressive type (HCC) - Plan: ARIPiprazole  (ABILIFY ) 10 MG tablet  Anxiety - Plan: ARIPiprazole  (ABILIFY ) 10 MG tablet  Patient is 58 year old Caucasian man with history of hypertension, sleep apnea, GERD, excessive sleepiness, low back pain, schizoaffective disorder and anxiety.  Currently symptoms are stable.  He do not have any paranoia or any anxiety.  He is very relief after he helped his friend who was about to commit suicide.  Patient told he is in touch with him and is very relief.  Patient does not want to change the medication since Abilify  working.  Will continue the current dose of Abilify  10 mg daily.  I reviewed blood work results.  Hemoglobin A1c 5.9.  Patient has no tremor or shakes or any EPS.  Follow-up in 3 months.   Follow Up Instructions:     I discussed the assessment and treatment plan with the patient. The patient was provided an opportunity to ask questions and all were answered. The patient agreed with the plan and demonstrated an understanding of  the instructions.   The patient was advised to call back or seek an in-person evaluation if the symptoms worsen or if the condition fails to improve as anticipated.    Collaboration of Care: Other provider involved in patient's care AEB notes are available in epic to review  Patient/Guardian was advised Release of Information must be obtained prior to any record release in order to collaborate their care with an outside provider. Patient/Guardian was advised if they have not already done so to contact the registration department to sign all necessary forms in order for us  to release information regarding their care.   Consent: Patient/Guardian gives verbal consent for treatment and assignment of benefits for services provided during this visit. Patient/Guardian expressed understanding and agreed to  proceed.     Total encounter time 18 minutes which includes face-to-face time, chart reviewed, care coordination, order entry and documentation during this encounter.   Note: This document was prepared by [employer] voice dictation technology and any errors that results from this process are unintentional.    Leni ONEIDA Client, MD 03/26/2024

## 2024-06-25 ENCOUNTER — Encounter (HOSPITAL_COMMUNITY): Payer: Self-pay | Admitting: Psychiatry

## 2024-06-25 ENCOUNTER — Telehealth (HOSPITAL_COMMUNITY): Admitting: Psychiatry

## 2024-06-25 VITALS — Wt 223.0 lb

## 2024-06-25 DIAGNOSIS — F419 Anxiety disorder, unspecified: Secondary | ICD-10-CM

## 2024-06-25 DIAGNOSIS — F251 Schizoaffective disorder, depressive type: Secondary | ICD-10-CM | POA: Diagnosis not present

## 2024-06-25 MED ORDER — ARIPIPRAZOLE 10 MG PO TABS: 10.0000 mg | ORAL_TABLET | Freq: Every day | ORAL | 0 refills | Status: AC

## 2024-06-25 NOTE — Progress Notes (Signed)
 " [facility] MD Virtual Progress Note   Patient Location: Home Provider Location: Home Office  I connect with patient by video and verified that I am speaking with correct person by using two identifiers. I discussed the limitations of evaluation and management by telemedicine and the availability of in person appointments. I also discussed with the patient that there may be a patient responsible charge related to this service. The patient expressed understanding and agreed to proceed.  Randall Mccann [id number] 59 y.o.  06/25/2024 1:08 PM  History of Present Illness:  Patient is evaluated by video session.  He reported due to snow and cold weather had not left the home today.  He reported back pain is on and off.  He is taking Lyrica.  He started taking low-dose melatonin that helps sleep.  Denies any hallucination, paranoia, agitation.  He denies any suicidal thoughts.  He denies any mania, crying spells.  He like Lyrica which is helping his back pain.  His appetite is okay.  His weight stable.  He has no tremor or shakes or any EPS.  He is taking Abilify  which is keeping his mood stable.  Past Psychiatric History: H/O irritability, anger, paranoia and trust issues since school age.  One inpatient as teenager and then twice in 1998.  No h/o suicidal attempt.  Tried lithium, Haldol, Risperdal and Paxil but no recall. H/O using drugs and alcohol .   Past Medical History:  Diagnosis Date   Essential hypertension    Hyperlipidemia    Idiopathic hypersomnia    Low back pain    Obstructive sleep apnea    Uses CPAP machine nightly   PTSD (post-traumatic stress disorder), in remission    Said to have occurred many years prior following [place]   Schizoaffective disorder, depressive type     Outpatient Encounter Medications as of 06/25/2024  Medication Sig   ARIPiprazole  (ABILIFY ) 10 MG tablet Take 1 tablet (10 mg total) by mouth daily.   Armodafinil 250 MG  tablet Take 250 mg by mouth every morning.   atorvastatin (LIPITOR) 40 MG tablet Take 40 mg by mouth daily.   cloNIDine (CATAPRES) 0.1 MG tablet Take 0.1 mg by mouth 2 (two) times daily.   doxycycline (VIBRAMYCIN) 50 MG capsule Take 50 mg by mouth 2 (two) times daily.   lisinopril (ZESTRIL) 40 MG tablet Take 40 mg by mouth daily.   melatonin 5 MG TABS Take 5 mg by mouth at bedtime.   metroNIDAZOLE (METROGEL) 1 % gel Apply topically daily.   Omega-3 Fatty Acids (FISH OIL) 1000 MG CAPS Take by mouth.   OZEMPIC, 1 MG/DOSE, 4 MG/3ML SOPN Inject 1 mg into the skin once a week.   pantoprazole (PROTONIX) 40 MG tablet Take 40 mg by mouth daily.   pregabalin (LYRICA) 25 MG capsule Take 25 mg by mouth 2 (two) times daily.   spironolactone (ALDACTONE) 25 MG tablet Take 1 tablet by mouth daily.   triamcinolone  ointment (KENALOG ) 0.1 %    No facility-administered encounter medications on file as of 06/25/2024.    No results found for this or any previous visit (from the past 2160 hours).   Psychiatric Specialty Exam: Physical Exam  Review of Systems  Musculoskeletal:  Positive for back pain.    Weight 223 lb (101.2 kg).There is no height or weight on file to calculate BMI.  General Appearance: Casual  Eye Contact:  Good  Speech:  Normal Rate  Volume:  Normal  Mood:  Euthymic  Affect:  Appropriate  Thought Process:  Goal Directed  Orientation:  Full (Time, Place, and Person)  Thought Content:  WDL  Suicidal Thoughts:  No  Homicidal Thoughts:  No  Memory:  Immediate;   Good Recent;   Good Remote;   Good  Judgement:  Good  Insight:  Present  Psychomotor Activity:  Normal  Concentration:  Concentration: Good and Attention Span: Good  Recall:  Good  Fund of Knowledge:  Good  Language:  Good  Akathisia:  No  Handed:  Right  AIMS (if indicated):     Assets:  Communication Skills Desire for Improvement Housing Resilience Transportation  ADL's:  Intact  Cognition:  WNL  Sleep:   better with melatonin       09/21/2023   11:17 AM  Depression screen PHQ 2/9  Decreased Interest 0  Down, Depressed, Hopeless 0  PHQ - 2 Score 0    Assessment/Plan: Schizoaffective disorder, depressive type (HCC) - Plan: ARIPiprazole  (ABILIFY ) 10 MG tablet  Anxiety - Plan: ARIPiprazole  (ABILIFY ) 10 MG tablet  Patient is 59 year old Caucasian man with schizoaffective disorder, depressed type and anxiety.  Currently stable on Abilify  10 mg.  He does not want to change the medication since mood stable.  He has no major concern or side effects.  Last hemoglobin A1c 5.9.  Discussed medication side effects and benefits.  Recommend to call back if is any question or any concern.  Follow-up in 3 months.  Follow Up Instructions:     I discussed the assessment and treatment plan with the patient. The patient was provided an opportunity to ask questions and all were answered. The patient agreed with the plan and demonstrated an understanding of the instructions.   The patient was advised to call back or seek an in-person evaluation if the symptoms worsen or if the condition fails to improve as anticipated.    Collaboration of Care: Other provider involved in patient's care AEB notes are available in epic to review  Patient/Guardian was advised Release of Information must be obtained prior to any record release in order to collaborate their care with an outside provider. Patient/Guardian was advised if they have not already done so to contact the registration department to sign all necessary forms in order for us  to release information regarding their care.   Consent: Patient/Guardian gives verbal consent for treatment and assignment of benefits for services provided during this visit. Patient/Guardian expressed understanding and agreed to proceed.     Total encounter time 17 minutes which includes face-to-face time, chart reviewed, care coordination, order entry and documentation during this  encounter.   Note: This document was prepared by [employer] voice dictation technology and any errors that results from this process are unintentional.    Leni ONEIDA Client, MD 06/25/2024   "

## 2024-09-24 ENCOUNTER — Telehealth (HOSPITAL_COMMUNITY): Admitting: Psychiatry
